# Patient Record
Sex: Female | Born: 1975 | Race: White | Hispanic: No | Marital: Married | Smoking: Never smoker
Health system: Southern US, Community
[De-identification: ages and names within clinical notes are randomized; demographics above are authoritative.]

## PROBLEM LIST (undated history)

## (undated) DIAGNOSIS — H539 Unspecified visual disturbance: Secondary | ICD-10-CM

## (undated) DIAGNOSIS — T7840XA Allergy, unspecified, initial encounter: Secondary | ICD-10-CM

## (undated) DIAGNOSIS — Z9109 Other allergy status, other than to drugs and biological substances: Secondary | ICD-10-CM

## (undated) DIAGNOSIS — I82602 Acute embolism and thrombosis of unspecified veins of left upper extremity: Secondary | ICD-10-CM

## (undated) DIAGNOSIS — U071 COVID-19: Secondary | ICD-10-CM

## (undated) DIAGNOSIS — D6851 Activated protein C resistance: Secondary | ICD-10-CM

## (undated) HISTORY — DX: Activated protein C resistance: D68.51

## (undated) HISTORY — DX: Unspecified visual disturbance: H53.9

## (undated) HISTORY — DX: COVID-19: U07.1

## (undated) HISTORY — DX: Allergy, unspecified, initial encounter: T78.40XA

## (undated) HISTORY — DX: Other allergy status, other than to drugs and biological substances: Z91.09

## (undated) HISTORY — DX: Acute embolism and thrombosis of unspecified veins of left upper extremity: I82.602

---

## 2002-06-11 ENCOUNTER — Other Ambulatory Visit: Admission: RE | Admit: 2002-06-11 | Discharge: 2002-06-11 | Payer: Self-pay | Admitting: Obstetrics & Gynecology

## 2003-06-05 ENCOUNTER — Other Ambulatory Visit: Admission: RE | Admit: 2003-06-05 | Discharge: 2003-06-05 | Payer: Self-pay | Admitting: Obstetrics & Gynecology

## 2003-07-21 ENCOUNTER — Other Ambulatory Visit: Admission: RE | Admit: 2003-07-21 | Discharge: 2003-07-21 | Payer: Self-pay | Admitting: Obstetrics & Gynecology

## 2005-06-14 ENCOUNTER — Inpatient Hospital Stay (HOSPITAL_COMMUNITY): Admission: AD | Admit: 2005-06-14 | Discharge: 2005-06-17 | Payer: Self-pay | Admitting: Obstetrics & Gynecology

## 2011-02-19 ENCOUNTER — Emergency Department (HOSPITAL_BASED_OUTPATIENT_CLINIC_OR_DEPARTMENT_OTHER)
Admission: EM | Admit: 2011-02-19 | Discharge: 2011-02-19 | Disposition: A | Discharge status: Home / Self Care | Payer: BC Managed Care – PPO | Attending: Emergency Medicine | Admitting: Emergency Medicine

## 2011-02-19 DIAGNOSIS — M7989 Other specified soft tissue disorders: Secondary | ICD-10-CM | POA: Insufficient documentation

## 2011-02-19 DIAGNOSIS — R609 Edema, unspecified: Secondary | ICD-10-CM | POA: Insufficient documentation

## 2011-02-20 ENCOUNTER — Other Ambulatory Visit (HOSPITAL_BASED_OUTPATIENT_CLINIC_OR_DEPARTMENT_OTHER): Payer: Self-pay | Admitting: Emergency Medicine

## 2011-02-20 ENCOUNTER — Ambulatory Visit (HOSPITAL_BASED_OUTPATIENT_CLINIC_OR_DEPARTMENT_OTHER)
Admission: RE | Admit: 2011-02-20 | Discharge: 2011-02-20 | Disposition: A | Discharge status: Home / Self Care | Payer: BC Managed Care – PPO | Source: Ambulatory Visit | Attending: Emergency Medicine | Admitting: Emergency Medicine

## 2011-02-20 DIAGNOSIS — M79609 Pain in unspecified limb: Secondary | ICD-10-CM | POA: Insufficient documentation

## 2011-02-20 DIAGNOSIS — R229 Localized swelling, mass and lump, unspecified: Secondary | ICD-10-CM

## 2011-02-20 DIAGNOSIS — R609 Edema, unspecified: Secondary | ICD-10-CM

## 2011-02-20 DIAGNOSIS — M7989 Other specified soft tissue disorders: Secondary | ICD-10-CM | POA: Insufficient documentation

## 2013-08-06 ENCOUNTER — Ambulatory Visit (INDEPENDENT_AMBULATORY_CARE_PROVIDER_SITE_OTHER): Payer: 59 | Admitting: Family Medicine

## 2013-08-06 ENCOUNTER — Encounter: Payer: Self-pay | Admitting: Family Medicine

## 2013-08-06 VITALS — BP 123/94 | HR 74 | Temp 99.2°F | Ht 64.0 in | Wt 216.0 lb

## 2013-08-06 DIAGNOSIS — J069 Acute upper respiratory infection, unspecified: Secondary | ICD-10-CM

## 2013-08-06 NOTE — Progress Notes (Signed)
  Subjective:    Patient ID: April Roman, female    DOB: 1976-08-26, 37 y.o.   MRN: [id number]  HPI  URI Symptoms Onset: 1-2 days  Description: rhinorrhea, nasal congestion Modifying factors:   None   Symptoms Nasal discharge: yes Fever: no Sore throat: no Cough: minimal  Wheezing: no Ear pain: no GI symptoms: no Sick contacts: no  Red Flags  Stiff neck: no Dyspnea: no Rash: no Swallowing difficulty: no  Sinusitis Risk Factors Headache/face pain: no Double sickening: no tooth pain: no  Allergy Risk Factors Sneezing: no Itchy scratchy throat: no Seasonal symptoms: no  Flu Risk Factors Headache: no muscle aches: no severe fatigue: no     Review of Systems  All other systems reviewed and are negative.       Objective:   Physical Exam  Constitutional: She appears well-developed and well-nourished.  HENT:  Head: Normocephalic and atraumatic.  Right Ear: External ear normal.  Left Ear: External ear normal.  +nasal erythema, rhinorrhea bilaterally, + post oropharyngeal erythema    Eyes: Conjunctivae are normal. Pupils are equal, round, and reactive to light.  Neck: Normal range of motion. Neck supple.  Cardiovascular: Normal rate and regular rhythm.   Pulmonary/Chest: Effort normal.  Abdominal: Soft.  Musculoskeletal: Normal range of motion.  Neurological: She is alert.  Skin: Skin is warm.          Assessment & Plan:  Viral URI   Suspect likely viral etiology of symptoms. Discussed supportive care and infectious red flags. Followup as needed.

## 2014-11-26 ENCOUNTER — Other Ambulatory Visit (HOSPITAL_COMMUNITY): Payer: Self-pay | Admitting: Nurse Practitioner

## 2014-11-26 DIAGNOSIS — B182 Chronic viral hepatitis C: Secondary | ICD-10-CM

## 2014-12-10 ENCOUNTER — Ambulatory Visit (HOSPITAL_COMMUNITY): Payer: BC Managed Care – PPO

## 2015-11-09 ENCOUNTER — Ambulatory Visit: Payer: Self-pay | Admitting: Family Medicine

## 2015-11-09 ENCOUNTER — Ambulatory Visit (INDEPENDENT_AMBULATORY_CARE_PROVIDER_SITE_OTHER): Payer: BLUE CROSS/BLUE SHIELD | Admitting: Family

## 2015-11-09 ENCOUNTER — Encounter: Payer: Self-pay | Admitting: Family

## 2015-11-09 VITALS — BP 132/95 | HR 83 | Temp 97.8°F | Ht 64.0 in | Wt 231.0 lb

## 2015-11-09 DIAGNOSIS — J069 Acute upper respiratory infection, unspecified: Secondary | ICD-10-CM

## 2015-11-09 MED ORDER — FLUTICASONE PROPIONATE 50 MCG/ACT NA SUSP: 2.0000 | Freq: Every day | NASAL | Status: DC

## 2015-11-09 MED ORDER — LORATADINE 10 MG PO CAPS: 10.0000 mg | ORAL_CAPSULE | Freq: Every day | ORAL | Status: DC

## 2015-11-09 MED ORDER — AZITHROMYCIN 250 MG PO TABS: ORAL_TABLET | ORAL | Status: DC

## 2015-11-09 NOTE — Progress Notes (Signed)
Subjective:    Patient ID: April Roman, female    DOB: 1976-10-19, 39 y.o.   MRN: [id number]  PT presents to the office to reestablish care and cough. PT states she did an online office visit and was given a RX of Biaxin for one week. PT states this improved her coughing, but continues to cough, sinus pressure, congestion, and rhinorrhea. PT states she is using Flonase and Claritin daily with mild relief.  Cough This is a new problem. The current episode started 1 to 4 weeks ago. The problem has been waxing and waning. The problem occurs every few minutes. The cough is non-productive. Associated symptoms include nasal congestion, postnasal drip and rhinorrhea. Pertinent negatives include no chills, ear congestion, ear pain, fever, headaches, myalgias, sore throat, shortness of breath or wheezing. The symptoms are aggravated by lying down. She has tried rest (Biaxin for one week) for the symptoms. The treatment provided mild relief. There is no history of asthma, COPD or environmental allergies.      Review of Systems  Constitutional: Negative.  Negative for fever and chills.  HENT: Positive for postnasal drip and rhinorrhea. Negative for ear pain and sore throat.   Eyes: Negative.   Respiratory: Positive for cough. Negative for shortness of breath and wheezing.   Cardiovascular: Negative.  Negative for palpitations.  Gastrointestinal: Negative.   Endocrine: Negative.   Genitourinary: Negative.   Musculoskeletal: Negative.  Negative for myalgias.  Allergic/Immunologic: Negative for environmental allergies.  Neurological: Negative.  Negative for headaches.  Hematological: Negative.   Psychiatric/Behavioral: Negative.   All other systems reviewed and are negative.      Objective:   Physical Exam  Constitutional: She is oriented to person, place, and time. She appears well-developed and well-nourished. No distress.  HENT:  Head: Normocephalic and atraumatic.  Right Ear: External ear  normal.  Left Ear: External ear normal.  Nasal passage erythemas with moderate swelling  Oropharynx erythemas     Eyes: Pupils are equal, round, and reactive to light.  Neck: Normal range of motion. Neck supple. No thyromegaly present.  Cardiovascular: Normal rate, regular rhythm, normal heart sounds and intact distal pulses.   No murmur heard. Pulmonary/Chest: Effort normal and breath sounds normal. No respiratory distress. She has no wheezes.  Abdominal: Soft. Bowel sounds are normal. She exhibits no distension. There is no tenderness.  Musculoskeletal: Normal range of motion. She exhibits no edema or tenderness.  Neurological: She is alert and oriented to person, place, and time. She has normal reflexes. No cranial nerve deficit.  Skin: Skin is warm and dry.  Psychiatric: She has a normal mood and affect. Her behavior is normal. Judgment and thought content normal.  Vitals reviewed.     BP 132/95 mmHg  Pulse 83  Temp(Src) 97.8 F (36.6 C) (Oral)  Ht 5' 4"  (1.626 m)  Wt 231 lb (104.781 kg)  BMI 39.63 kg/m2     Assessment & Plan:  1. Acute upper respiratory infection -- Take meds as prescribed - Use a cool mist humidifier  -Use saline nose sprays frequently -Saline irrigations of the nose can be very helpful if done frequently.  * 4X daily for 1 week*  * Use of a nettie pot can be helpful with this. Follow directions with this* -Force fluids -For any cough or congestion  Use plain Mucinex- regular strength or max strength is fine   * Children- consult with Pharmacist for dosing -For fever or aces or pains- take tylenol or ibuprofen  appropriate for age and weight.  * for fevers greater than 101 orally you may alternate ibuprofen and tylenol every  3 hours. -Throat lozenges if help - azithromycin (ZITHROMAX Z-PAK) 250 MG tablet; As directed  Dispense: 1 each; Refill: 0 - fluticasone (FLONASE) 50 MCG/ACT nasal spray; Place 2 sprays into both nostrils daily.  Dispense: 16  g; Refill: 6 - Loratadine (CLARITIN) 10 MG CAPS; Take 1 capsule (10 mg total) by mouth daily.  Dispense: 30 each; Refill: [place], FNP

## 2015-11-09 NOTE — Patient Instructions (Signed)
Upper Respiratory Infection, Adult Most upper respiratory infections (URIs) are a viral infection of the air passages leading to the lungs. A URI affects the nose, throat, and upper air passages. The most common type of URI is nasopharyngitis and is typically referred to as "the common cold." URIs run their course and usually go away on their own. Most of the time, a URI does not require medical attention, but sometimes a bacterial infection in the upper airways can follow a viral infection. This is called a secondary infection. Sinus and middle ear infections are common types of secondary upper respiratory infections. Bacterial pneumonia can also complicate a URI. A URI can worsen asthma and chronic obstructive pulmonary disease (COPD). Sometimes, these complications can require emergency medical care and may be life threatening.  CAUSES Almost all URIs are caused by viruses. A virus is a type of germ and can spread from one person to another.  RISKS FACTORS You may be at risk for a URI if:   You smoke.   You have chronic heart or lung disease.  You have a weakened defense (immune) system.   You are very young or very old.   You have nasal allergies or asthma.  You work in crowded or poorly ventilated areas.  You work in health care facilities or schools. SIGNS AND SYMPTOMS  Symptoms typically develop 2-3 days after you come in contact with a cold virus. Most viral URIs last 7-10 days. However, viral URIs from the influenza virus (flu virus) can last 14-18 days and are typically more severe. Symptoms may include:   Runny or stuffy (congested) nose.   Sneezing.   Cough.   Sore throat.   Headache.   Fatigue.   Fever.   Loss of appetite.   Pain in your forehead, behind your eyes, and over your cheekbones (sinus pain).  Muscle aches.  DIAGNOSIS  Your health care provider may diagnose a URI by:  Physical exam.  Tests to check that your symptoms are not due to  another condition such as:  Strep throat.  Sinusitis.  Pneumonia.  Asthma. TREATMENT  A URI goes away on its own with time. It cannot be cured with medicines, but medicines may be prescribed or recommended to relieve symptoms. Medicines may help:  Reduce your fever.  Reduce your cough.  Relieve nasal congestion. HOME CARE INSTRUCTIONS   Take medicines only as directed by your health care provider.   Gargle warm saltwater or take cough drops to comfort your throat as directed by your health care provider.  Use a warm mist humidifier or inhale steam from a shower to increase air moisture. This may make it easier to breathe.  Drink enough fluid to keep your urine clear or pale yellow.   Eat soups and other clear broths and maintain good nutrition.   Rest as needed.   Return to work when your temperature has returned to normal or as your health care provider advises. You may need to stay home longer to avoid infecting others. You can also use a face mask and careful hand washing to prevent spread of the virus.  Increase the usage of your inhaler if you have asthma.   Do not use any tobacco products, including cigarettes, chewing tobacco, or electronic cigarettes. If you need help quitting, ask your health care provider. PREVENTION  The best way to protect yourself from getting a cold is to practice good hygiene.   Avoid oral or hand contact with people with cold   symptoms.   Wash your hands often if contact occurs.  There is no clear evidence that vitamin C, vitamin E, echinacea, or exercise reduces the chance of developing a cold. However, it is always recommended to get plenty of rest, exercise, and practice good nutrition.  SEEK MEDICAL CARE IF:   You are getting worse rather than better.   Your symptoms are not controlled by medicine.   You have chills.  You have worsening shortness of breath.  You have brown or red mucus.  You have yellow or brown nasal  discharge.  You have pain in your face, especially when you bend forward.  You have a fever.  You have swollen neck glands.  You have pain while swallowing.  You have white areas in the back of your throat. SEEK IMMEDIATE MEDICAL CARE IF:   You have severe or persistent:  Headache.  Ear pain.  Sinus pain.  Chest pain.  You have chronic lung disease and any of the following:  Wheezing.  Prolonged cough.  Coughing up blood.  A change in your usual mucus.  You have a stiff neck.  You have changes in your:  Vision.  Hearing.  Thinking.  Mood. MAKE SURE YOU:   Understand these instructions.  Will watch your condition.  Will get help right away if you are not doing well or get worse.   This information is not intended to replace advice given to you by your health care provider. Make sure you discuss any questions you have with your health care provider.   Document Released: 05/10/2001 Document Revised: 03/31/2015 Document Reviewed: 02/19/2014 Elsevier Interactive Patient Education 2016 [place] meds as prescribed - Use a cool mist humidifier  -Use saline nose sprays frequently -Saline irrigations of the nose can be very helpful if done frequently.  * 4X daily for 1 week*  * Use of a nettie pot can be helpful with this. Follow directions with this* -Force fluids -For any cough or congestion  Use plain Mucinex- regular strength or max strength is fine   * Children- consult with Pharmacist for dosing -For fever or aces or pains- take tylenol or ibuprofen appropriate for age and weight.  * for fevers greater than 101 orally you may alternate ibuprofen and tylenol every  3 hours. -Throat lozenges if help   Evelina Dun, FNP

## 2015-11-20 ENCOUNTER — Ambulatory Visit (INDEPENDENT_AMBULATORY_CARE_PROVIDER_SITE_OTHER): Payer: BLUE CROSS/BLUE SHIELD | Admitting: Family

## 2015-11-20 ENCOUNTER — Encounter: Payer: Self-pay | Admitting: Family

## 2015-11-20 VITALS — BP 128/89 | HR 119 | Temp 98.0°F | Ht 64.0 in | Wt 231.0 lb

## 2015-11-20 DIAGNOSIS — J069 Acute upper respiratory infection, unspecified: Secondary | ICD-10-CM

## 2015-11-20 MED ORDER — BENZONATATE 200 MG PO CAPS: 200.0000 mg | ORAL_CAPSULE | Freq: Three times a day (TID) | ORAL | Status: DC | PRN

## 2015-11-20 MED ORDER — HYDROCODONE-HOMATROPINE 5-1.5 MG/5ML PO SYRP: 5.0000 mL | ORAL_SOLUTION | Freq: Three times a day (TID) | ORAL | Status: DC | PRN

## 2015-11-20 MED ORDER — PREDNISONE 10 MG (21) PO TBPK: 10.0000 mg | ORAL_TABLET | Freq: Every day | ORAL | Status: DC

## 2015-11-20 MED ORDER — LEVOFLOXACIN 500 MG PO TABS: 500.0000 mg | ORAL_TABLET | Freq: Every day | ORAL | Status: DC

## 2015-11-20 NOTE — Patient Instructions (Signed)
Upper Respiratory Infection, Adult Most upper respiratory infections (URIs) are a viral infection of the air passages leading to the lungs. A URI affects the nose, throat, and upper air passages. The most common type of URI is nasopharyngitis and is typically referred to as "the common cold." URIs run their course and usually go away on their own. Most of the time, a URI does not require medical attention, but sometimes a bacterial infection in the upper airways can follow a viral infection. This is called a secondary infection. Sinus and middle ear infections are common types of secondary upper respiratory infections. Bacterial pneumonia can also complicate a URI. A URI can worsen asthma and chronic obstructive pulmonary disease (COPD). Sometimes, these complications can require emergency medical care and may be life threatening.  CAUSES Almost all URIs are caused by viruses. A virus is a type of germ and can spread from one person to another.  RISKS FACTORS You may be at risk for a URI if:   You smoke.   You have chronic heart or lung disease.  You have a weakened defense (immune) system.   You are very young or very old.   You have nasal allergies or asthma.  You work in crowded or poorly ventilated areas.  You work in health care facilities or schools. SIGNS AND SYMPTOMS  Symptoms typically develop 2-3 days after you come in contact with a cold virus. Most viral URIs last 7-10 days. However, viral URIs from the influenza virus (flu virus) can last 14-18 days and are typically more severe. Symptoms may include:   Runny or stuffy (congested) nose.   Sneezing.   Cough.   Sore throat.   Headache.   Fatigue.   Fever.   Loss of appetite.   Pain in your forehead, behind your eyes, and over your cheekbones (sinus pain).  Muscle aches.  DIAGNOSIS  Your health care provider may diagnose a URI by:  Physical exam.  Tests to check that your symptoms are not due to  another condition such as:  Strep throat.  Sinusitis.  Pneumonia.  Asthma. TREATMENT  A URI goes away on its own with time. It cannot be cured with medicines, but medicines may be prescribed or recommended to relieve symptoms. Medicines may help:  Reduce your fever.  Reduce your cough.  Relieve nasal congestion. HOME CARE INSTRUCTIONS   Take medicines only as directed by your health care provider.   Gargle warm saltwater or take cough drops to comfort your throat as directed by your health care provider.  Use a warm mist humidifier or inhale steam from a shower to increase air moisture. This may make it easier to breathe.  Drink enough fluid to keep your urine clear or pale yellow.   Eat soups and other clear broths and maintain good nutrition.   Rest as needed.   Return to work when your temperature has returned to normal or as your health care provider advises. You may need to stay home longer to avoid infecting others. You can also use a face mask and careful hand washing to prevent spread of the virus.  Increase the usage of your inhaler if you have asthma.   Do not use any tobacco products, including cigarettes, chewing tobacco, or electronic cigarettes. If you need help quitting, ask your health care provider. PREVENTION  The best way to protect yourself from getting a cold is to practice good hygiene.   Avoid oral or hand contact with people with cold   symptoms.   Wash your hands often if contact occurs.  There is no clear evidence that vitamin C, vitamin E, echinacea, or exercise reduces the chance of developing a cold. However, it is always recommended to get plenty of rest, exercise, and practice good nutrition.  SEEK MEDICAL CARE IF:   You are getting worse rather than better.   Your symptoms are not controlled by medicine.   You have chills.  You have worsening shortness of breath.  You have brown or red mucus.  You have yellow or brown nasal  discharge.  You have pain in your face, especially when you bend forward.  You have a fever.  You have swollen neck glands.  You have pain while swallowing.  You have white areas in the back of your throat. SEEK IMMEDIATE MEDICAL CARE IF:   You have severe or persistent:  Headache.  Ear pain.  Sinus pain.  Chest pain.  You have chronic lung disease and any of the following:  Wheezing.  Prolonged cough.  Coughing up blood.  A change in your usual mucus.  You have a stiff neck.  You have changes in your:  Vision.  Hearing.  Thinking.  Mood. MAKE SURE YOU:   Understand these instructions.  Will watch your condition.  Will get help right away if you are not doing well or get worse.   This information is not intended to replace advice given to you by your health care provider. Make sure you discuss any questions you have with your health care provider.   Document Released: 05/10/2001 Document Revised: 03/31/2015 Document Reviewed: 02/19/2014 Elsevier Interactive Patient Education 2016 [place] meds as prescribed - Use a cool mist humidifier  -Use saline nose sprays frequently -Saline irrigations of the nose can be very helpful if done frequently.  * 4X daily for 1 week*  * Use of a nettie pot can be helpful with this. Follow directions with this* -Force fluids -For any cough or congestion  Use plain Mucinex- regular strength or max strength is fine   * Children- consult with Pharmacist for dosing -For fever or aces or pains- take tylenol or ibuprofen appropriate for age and weight.  * for fevers greater than 101 orally you may alternate ibuprofen and tylenol every  3 hours. -Throat lozenges if help -New toothbrush in 3 days   April Dun, FNP

## 2015-11-20 NOTE — Progress Notes (Signed)
Subjective:    Patient ID: April Roman, female    DOB: Jul 17, 1976, 39 y.o.   MRN: [id number]  Cough This is a recurrent problem. The current episode started 1 to 4 weeks ago. The problem has been waxing and waning. The problem occurs every few hours. The cough is non-productive. Associated symptoms include chills, ear congestion, a fever, headaches, nasal congestion, postnasal drip, rhinorrhea and wheezing. Pertinent negatives include no ear pain, myalgias, sore throat or shortness of breath. The symptoms are aggravated by lying down. She has tried rest (Zpak, Mucinex, flonase) for the symptoms. The treatment provided mild relief. Her past medical history is significant for environmental allergies. There is no history of asthma.      Review of Systems  Constitutional: Positive for fever and chills.  HENT: Positive for postnasal drip and rhinorrhea. Negative for ear pain and sore throat.   Eyes: Negative.   Respiratory: Positive for cough and wheezing. Negative for shortness of breath.   Cardiovascular: Negative.  Negative for palpitations.  Gastrointestinal: Negative.   Endocrine: Negative.   Genitourinary: Negative.   Musculoskeletal: Negative.  Negative for myalgias.  Allergic/Immunologic: Positive for environmental allergies.  Neurological: Positive for headaches.  Hematological: Negative.   Psychiatric/Behavioral: Negative.   All other systems reviewed and are negative.      Objective:   Physical Exam  Constitutional: She is oriented to person, place, and time. She appears well-developed and well-nourished. No distress.  HENT:  Head: Normocephalic and atraumatic.  Right Ear: External ear normal.  Left Ear: External ear normal.  Nasal passage erythemas with mild swelling  Oropharynx erythemas   Eyes: Pupils are equal, round, and reactive to light.  Neck: Normal range of motion. Neck supple. No thyromegaly present.  Cardiovascular: Normal rate, regular rhythm, normal  heart sounds and intact distal pulses.   No murmur heard. Pulmonary/Chest: Effort normal and breath sounds normal. No respiratory distress. She has no wheezes.  Nonproductive cough  Abdominal: Soft. Bowel sounds are normal. She exhibits no distension. There is no tenderness.  Musculoskeletal: Normal range of motion. She exhibits no edema or tenderness.  Neurological: She is alert and oriented to person, place, and time. She has normal reflexes. No cranial nerve deficit.  Skin: Skin is warm and dry.  Psychiatric: She has a normal mood and affect. Her behavior is normal. Judgment and thought content normal.  Vitals reviewed.     BP 128/89 mmHg  Pulse 119  Temp(Src) 98 F (36.7 C) (Oral)  Ht 5' 4"  (1.626 m)  Wt 231 lb (104.781 kg)  BMI 39.63 kg/m2     Assessment & Plan:  1. Acute upper respiratory infection -- Take meds as prescribed - Use a cool mist humidifier  -Use saline nose sprays frequently -Saline irrigations of the nose can be very helpful if done frequently.  * 4X daily for 1 week*  * Use of a nettie pot can be helpful with this. Follow directions with this* -Force fluids -For any cough or congestion  Use plain Mucinex- regular strength or max strength is fine   * Children- consult with Pharmacist for dosing -For fever or aces or pains- take tylenol or ibuprofen appropriate for age and weight.  * for fevers greater than 101 orally you may alternate ibuprofen and tylenol every  3 hours. -Throat lozenges if help - levofloxacin (LEVAQUIN) 500 MG tablet; Take 1 tablet (500 mg total) by mouth daily.  Dispense: 7 tablet; Refill: 0 - benzonatate (TESSALON) 200 MG capsule;  Take 1 capsule (200 mg total) by mouth 3 (three) times daily as needed.  Dispense: 30 capsule; Refill: 1 - HYDROcodone-homatropine (HYCODAN) 5-1.5 MG/5ML syrup; Take 5 mLs by mouth every 8 (eight) hours as needed for cough.  Dispense: 120 mL; Refill: 0 - predniSONE (STERAPRED UNI-PAK 21 TAB) 10 MG (21) TBPK  tablet; Take 1 tablet (10 mg total) by mouth daily. As directed x 6 days  Dispense: 21 tablet; Refill: 0  Evelina Dun, FNP

## 2015-12-03 ENCOUNTER — Encounter: Payer: Self-pay | Admitting: Family Medicine

## 2015-12-03 ENCOUNTER — Ambulatory Visit (INDEPENDENT_AMBULATORY_CARE_PROVIDER_SITE_OTHER): Payer: BLUE CROSS/BLUE SHIELD | Admitting: Family Medicine

## 2015-12-03 DIAGNOSIS — R52 Pain, unspecified: Secondary | ICD-10-CM | POA: Diagnosis not present

## 2015-12-03 DIAGNOSIS — R509 Fever, unspecified: Secondary | ICD-10-CM | POA: Diagnosis not present

## 2015-12-03 LAB — POCT INFLUENZA A/B
INFLUENZA B, POC: NEGATIVE
Influenza A, POC: NEGATIVE

## 2015-12-03 MED ORDER — OSELTAMIVIR PHOSPHATE 75 MG PO CAPS: 75.0000 mg | ORAL_CAPSULE | Freq: Two times a day (BID) | ORAL | Status: DC

## 2015-12-03 NOTE — Progress Notes (Signed)
   HPI  Patient presents today here with flulike illness.  Patient explains that she's had severe symptoms over the last 24 hours. She had a fever of 100.8 last night. She has a most prominent symptom of body aches and pressure behind her eyes and headache. She also has muscle stiffness in her neck, back, arms and legs. She denies any trouble tolerating clear fluids.  She has no dyspnea, cough, or chest pain. Denies ear pain as well  PMH: Smoking status noted ROS: Per HPI  Objective: BP 148/99 mmHg  Pulse 106  Temp(Src) 97.6 F (36.4 C) (Oral)  Ht 5' 4"  (1.626 m)  Wt 235 lb 12.8 oz (106.958 kg)  BMI 40.45 kg/m2 Gen: NAD, alert, cooperative with exam HEENT: NCAT, right TM normal, left TM obscured by cerumen, nares with some swelling, oropharynx clear CV: RRR, good S1/S2, no murmur Resp: CTABL, no wheezes, non-labored Ext: No edema, warm Neuro: Alert and oriented, No gross deficits  Assessment and plan:  # Body aches, flu-like illness.  Negative rapid flu, but considering symptoms I think that she is falling in teh 30% the rapid flu test misses. Treating with tamiflu NSAIDs, Fluids, rest RTC if worsening or does not get better.     Orders Placed This Encounter  Procedures  . POCT Influenza A/B    No orders of the defined types were placed in this encounter.    Laroy Apple, MD [facility] 12/03/2015, 11:52 AM

## 2015-12-03 NOTE — Patient Instructions (Addendum)
Great to meet you!  I believe you have the flu, so we are treating you with tamiflu   Influenza, Adult Influenza ("the flu") is a viral infection of the respiratory tract. It occurs more often in winter months because people spend more time in close contact with one another. Influenza can make you feel very sick. Influenza easily spreads from person to person (contagious). CAUSES  Influenza is caused by a virus that infects the respiratory tract. You can catch the virus by breathing in droplets from an infected person's cough or sneeze. You can also catch the virus by touching something that was recently contaminated with the virus and then touching your mouth, nose, or eyes. RISKS AND COMPLICATIONS You may be at risk for a more severe case of influenza if you smoke cigarettes, have diabetes, have chronic heart disease (such as heart failure) or lung disease (such as asthma), or if you have a weakened immune system. Elderly people and pregnant women are also at risk for more serious infections. The most common problem of influenza is a lung infection (pneumonia). Sometimes, this problem can require emergency medical care and may be life threatening. SIGNS AND SYMPTOMS  Symptoms typically last 4 to 10 days and may include:  Fever.  Chills.  Headache, body aches, and muscle aches.  Sore throat.  Chest discomfort and cough.  Poor appetite.  Weakness or feeling tired.  Dizziness.  Nausea or vomiting. DIAGNOSIS  Diagnosis of influenza is often made based on your history and a physical exam. A nose or throat swab test can be done to confirm the diagnosis. TREATMENT  In mild cases, influenza goes away on its own. Treatment is directed at relieving symptoms. For more severe cases, your health care provider may prescribe antiviral medicines to shorten the sickness. Antibiotic medicines are not effective because the infection is caused by a virus, not by bacteria. HOME CARE  INSTRUCTIONS  Take medicines only as directed by your health care provider.  Use a cool mist humidifier to make breathing easier.  Get plenty of rest until your temperature returns to normal. This usually takes 3 to 4 days.  Drink enough fluid to keep your urine clear or pale yellow.  Cover yourmouth and nosewhen coughing or sneezing,and wash your handswellto prevent thevirusfrom spreading.  Stay homefromwork orschool untilthe fever is gonefor at least 74fll day. PREVENTION  An annual influenza vaccination (flu shot) is the best way to avoid getting influenza. An annual flu shot is now routinely recommended for all adults in the [place]:  You experiencechest pain, yourcough worsens,or you producemore mucus.  Youhave nausea,vomiting, ordiarrhea.  Your fever returns or gets worse. SEEK IMMEDIATE MEDICAL CARE IF:  You havetrouble breathing, you become short of breath,or your skin ornails becomebluish.  You have severe painor stiffnessin the neck.  You develop a sudden headache, or pain in the face or ear.  You have nausea or vomiting that you cannot control. MAKE SURE YOU:   Understand these instructions.  Will watch your condition.  Will get help right away if you are not doing well or get worse.   This information is not intended to replace advice given to you by your health care provider. Make sure you discuss any questions you have with your health care provider.   Document Released: 11/11/2000 Document Revised: 12/05/2014 Document Reviewed: 02/13/2012 Elsevier Interactive Patient Education 2[employer]

## 2015-12-05 ENCOUNTER — Telehealth: Payer: Self-pay | Admitting: Family Medicine

## 2015-12-05 NOTE — Telephone Encounter (Signed)
I spoke to her husband Darnelle Maffucci this afternoon about concern that she is still ill. Seen on 1/5 by Dr. Wendi Snipes for onset of sx on 1/4 which were dx as Influenza. Caller/husband read about sx online and now concerned about meningitis risk. She is still feeling achy. Intermittent low grade fever. Vomited X 4, Last was yesterday at 10:30 AM. Now holding down small amounts of fluids. No nausea, but appetite poor . She is awake and alert, lucid. Complains of some HA and neck pain, but is able to move neck without excessive pain. Pt. Has no rash.   A: Symptoms do not sound consistent with meningismus. She may have a strain of flu or flu-like illness resistant to tamiflu. P: Pt. Encouraged to continue pushing fluids. Rest. Ibuprofen &/or tylenol as needed.      Consider going to E.D. Or calling 911 for worsening HA, stiffness of neck, resumption of vomiting and especially if there is a decline of mental status.  Claretta Fraise, M.D. 12/05/2015 4:00 PM

## 2015-12-08 ENCOUNTER — Encounter (HOSPITAL_BASED_OUTPATIENT_CLINIC_OR_DEPARTMENT_OTHER): Payer: Self-pay | Admitting: *Deleted

## 2015-12-08 ENCOUNTER — Emergency Department (HOSPITAL_BASED_OUTPATIENT_CLINIC_OR_DEPARTMENT_OTHER)
Admission: EM | Admit: 2015-12-08 | Discharge: 2015-12-08 | Disposition: A | Discharge status: Home / Self Care | Payer: BLUE CROSS/BLUE SHIELD | Attending: Emergency Medicine | Admitting: Emergency Medicine

## 2015-12-08 DIAGNOSIS — Z79899 Other long term (current) drug therapy: Secondary | ICD-10-CM | POA: Insufficient documentation

## 2015-12-08 DIAGNOSIS — R34 Anuria and oliguria: Secondary | ICD-10-CM | POA: Insufficient documentation

## 2015-12-08 DIAGNOSIS — R6883 Chills (without fever): Secondary | ICD-10-CM | POA: Insufficient documentation

## 2015-12-08 DIAGNOSIS — R61 Generalized hyperhidrosis: Secondary | ICD-10-CM | POA: Insufficient documentation

## 2015-12-08 DIAGNOSIS — R338 Other retention of urine: Secondary | ICD-10-CM

## 2015-12-08 DIAGNOSIS — Z3202 Encounter for pregnancy test, result negative: Secondary | ICD-10-CM | POA: Insufficient documentation

## 2015-12-08 DIAGNOSIS — Z7951 Long term (current) use of inhaled steroids: Secondary | ICD-10-CM | POA: Insufficient documentation

## 2015-12-08 DIAGNOSIS — R339 Retention of urine, unspecified: Secondary | ICD-10-CM | POA: Insufficient documentation

## 2015-12-08 DIAGNOSIS — R3915 Urgency of urination: Secondary | ICD-10-CM | POA: Insufficient documentation

## 2015-12-08 LAB — COMPREHENSIVE METABOLIC PANEL
ALT: 23 U/L (ref 14–54)
AST: 19 U/L (ref 15–41)
Albumin: 3.5 g/dL (ref 3.5–5.0)
Alkaline Phosphatase: 108 U/L (ref 38–126)
Anion gap: 5 (ref 5–15)
BILIRUBIN TOTAL: 0.4 mg/dL (ref 0.3–1.2)
BUN: 15 mg/dL (ref 6–20)
CHLORIDE: 107 mmol/L (ref 101–111)
CO2: 27 mmol/L (ref 22–32)
Calcium: 9 mg/dL (ref 8.9–10.3)
Creatinine, Ser: 0.68 mg/dL (ref 0.44–1.00)
Glucose, Bld: 109 mg/dL — ABNORMAL HIGH (ref 65–99)
POTASSIUM: 3.9 mmol/L (ref 3.5–5.1)
Sodium: 139 mmol/L (ref 135–145)
TOTAL PROTEIN: 7.2 g/dL (ref 6.5–8.1)

## 2015-12-08 LAB — CBC WITH DIFFERENTIAL/PLATELET
BASOS ABS: 0 10*3/uL (ref 0.0–0.1)
Basophils Relative: 0 %
EOS ABS: 0.1 10*3/uL (ref 0.0–0.7)
EOS PCT: 1 %
HCT: 38 % (ref 36.0–46.0)
HEMOGLOBIN: 12.2 g/dL (ref 12.0–15.0)
LYMPHS PCT: 17 %
Lymphs Abs: 1.7 10*3/uL (ref 0.7–4.0)
MCH: 28.9 pg (ref 26.0–34.0)
MCHC: 32.1 g/dL (ref 30.0–36.0)
MCV: 90 fL (ref 78.0–100.0)
Monocytes Absolute: 0.6 10*3/uL (ref 0.1–1.0)
Monocytes Relative: 6 %
NEUTROS PCT: 76 %
Neutro Abs: 7.6 10*3/uL (ref 1.7–7.7)
PLATELETS: 375 10*3/uL (ref 150–400)
RBC: 4.22 MIL/uL (ref 3.87–5.11)
RDW: 14 % (ref 11.5–15.5)
WBC: 10.1 10*3/uL (ref 4.0–10.5)

## 2015-12-08 LAB — URINALYSIS, ROUTINE W REFLEX MICROSCOPIC
BILIRUBIN URINE: NEGATIVE
GLUCOSE, UA: NEGATIVE mg/dL
KETONES UR: 15 mg/dL — AB
LEUKOCYTES UA: NEGATIVE
NITRITE: NEGATIVE
PH: 5.5 (ref 5.0–8.0)
PROTEIN: NEGATIVE mg/dL
Specific Gravity, Urine: 1.028 (ref 1.005–1.030)

## 2015-12-08 LAB — URINE MICROSCOPIC-ADD ON

## 2015-12-08 LAB — PREGNANCY, URINE: PREG TEST UR: NEGATIVE

## 2015-12-08 MED ORDER — SODIUM CHLORIDE 0.9 % IV BOLUS (SEPSIS)
1000.0000 mL | Freq: Once | INTRAVENOUS | Status: AC
  Administered 2015-12-08: 1000 mL via INTRAVENOUS

## 2015-12-08 NOTE — ED Provider Notes (Signed)
CSN: [id number]     Arrival date & time 12/08/15  1746 History  By signing my name below, I, Irene Pap, attest that this documentation has been prepared under the direction and in the presence of Quintella Reichert, MD. Electronically Signed: Irene Pap, ED Scribe. 12/08/2015. 7:59 PM.   Chief Complaint  Patient presents with  . Urinary Retention   The history is provided by the patient. No language interpreter was used.  HPI Comments: April Roman is a 40 y.o. female who presents to the Emergency Department complaining of decreased urine output onset 1 day ago. She states that she had urgency, but was unable to void. She recently got over the flu, finishing Tamiflu and Amoxicillin. She reports that she did not drink or eat much yesterday. She reports associated chills and diaphoresis. Per triage note, bladder scan shows 885 ml of urine in pt after 36 hours of not being able to void. She denies a hx of similar symptoms, fever, abdominal pain, vomiting, or leg swelling.  History reviewed. No pertinent past medical history. History reviewed. No pertinent past surgical history. Family History  Problem Relation Age of Onset  . Arthritis Mother   . Hypertension Mother   . Diabetes Father   . Heart disease Father   . Hypertension Father    Social History  Substance Use Topics  . Smoking status: Never Smoker   . Smokeless tobacco: Never Used  . Alcohol Use: Yes     Comment: occ   OB History    No data available     Review of Systems  Constitutional: Positive for chills and diaphoresis. Negative for fever.  Cardiovascular: Negative for leg swelling.  Gastrointestinal: Negative for vomiting and abdominal pain.  Genitourinary: Positive for urgency and decreased urine volume.  All other systems reviewed and are negative.  Allergies  Review of patient's allergies indicates no known allergies.  Home Medications   Prior to Admission medications   Medication Sig Start Date End  Date Taking? Authorizing Provider  acetaminophen (TYLENOL) 325 MG tablet Take 650 mg by mouth every 6 (six) hours as needed.   Yes Historical Provider, MD  amoxicillin-clavulanate (AUGMENTIN) 875-125 MG tablet Take 1 tablet by mouth 2 (two) times daily.   Yes Historical Provider, MD  ibuprofen (ADVIL,MOTRIN) 400 MG tablet Take 400 mg by mouth every 6 (six) hours as needed.   Yes Historical Provider, MD  fluticasone (FLONASE) 50 MCG/ACT nasal spray Place 2 sprays into both nostrils daily. 11/09/15   Sharion Balloon, FNP  oseltamivir (TAMIFLU) 75 MG capsule Take 1 capsule (75 mg total) by mouth 2 (two) times daily. 12/03/15   Timmothy Euler, MD   BP 143/92 mmHg  Pulse 86  Temp(Src) 98.3 F (36.8 C) (Oral)  Resp 20  SpO2 100%  LMP 11/29/2015 Physical Exam  Constitutional: She is oriented to person, place, and time. She appears well-developed and well-nourished.  HENT:  Head: Normocephalic and atraumatic.  Cardiovascular: Normal rate and regular rhythm.   No murmur heard. Pulmonary/Chest: Effort normal and breath sounds normal. No respiratory distress.  Abdominal: Soft. There is no tenderness. There is no rebound and no guarding.  Genitourinary:  Foley catheter in place with dark urine in the bag  Musculoskeletal: She exhibits no edema or tenderness.  Neurological: She is alert and oriented to person, place, and time.  Skin: Skin is warm and dry.  Psychiatric: She has a normal mood and affect. Her behavior is normal.  Nursing note and  vitals reviewed.   ED Course  Procedures (including critical care time) DIAGNOSTIC STUDIES: Oxygen Saturation is 100% on RA, normal by my interpretation.    COORDINATION OF CARE: 6:51 PM-Discussed treatment plan which includes foley catheter and labs with pt at bedside and pt agreed to plan.    Labs Review Labs Reviewed  URINALYSIS, ROUTINE W REFLEX MICROSCOPIC (NOT AT [facility]) - Abnormal; Notable for the following:    Color, Urine AMBER (*)    Hgb  urine dipstick MODERATE (*)    Ketones, ur 15 (*)    All other components within normal limits  COMPREHENSIVE METABOLIC PANEL - Abnormal; Notable for the following:    Glucose, Bld 109 (*)    All other components within normal limits  URINE MICROSCOPIC-ADD ON - Abnormal; Notable for the following:    Squamous Epithelial / LPF 0-5 (*)    Bacteria, UA FEW (*)    All other components within normal limits  URINE CULTURE  CBC WITH DIFFERENTIAL/PLATELET  PREGNANCY, URINE    Imaging Review No results found. I have personally reviewed and evaluated these images and lab results as part of my medical decision-making.   EKG Interpretation None      MDM   Final diagnoses:  Acute urinary retention   Patient here for evaluation of urinary retention. Foley catheter was placed with relief of her symptoms. She was recently treated for URI type symptoms with Tamiflu in this course is completed. She has been taking Sudafed today. Discussed stopping the Sudafed as this may have contributed to her symptoms as well as oral fluid hydration. Recommend outpatient follow-up with urology. Foley catheter discontinued in the emergency department prior to discharge with very close return precautions if the patient develops recurrent retention.  I personally performed the services described in this documentation, which was scribed in my presence. The recorded information has been reviewed and is accurate.    Quintella Reichert, MD 12/09/15 0104

## 2015-12-08 NOTE — ED Notes (Signed)
Bladder scan showed [place] notified and [place] Notified.

## 2015-12-08 NOTE — ED Notes (Signed)
MD at bedside. Patient in no distress.

## 2015-12-08 NOTE — Discharge Instructions (Signed)
Acute Urinary Retention, Female Acute urinary retention is the temporary inability to urinate. This is an uncommon problem in women. It can be caused by:  Infection.  A side effect of a medicine.  A problem in a nearby organ that presses or squeezes on the bladder or the urethra (the tube that drains the bladder).  Psychological problems.   Surgery on your bladder, urethra, or pelvic organs that causes obstruction to the outflow of urine from your bladder. HOME CARE INSTRUCTIONS  If you are sent home with a Foley catheter and a drainage system, you will need to discuss the best course of action with your health care provider. While the catheter is in, maintain a good intake of fluids. Keep the drainage bag emptied and lower than your catheter. This is so that contaminated urine will not flow back into your bladder, which could lead to a urinary tract infection. There are two main types of drainage bags. One is a large bag that usually is used at night. It has a good capacity that will allow you to sleep through the night without having to empty it. The second type is called a leg bag. It has a smaller capacity so it needs to be emptied more frequently. However, the main advantage is that it can be attached by a leg strap and goes underneath your clothing, allowing you the freedom to move about or leave your home. Only take over-the-counter or prescription medicines for pain, discomfort, or fever as directed by your health care provider.  SEEK MEDICAL CARE IF:  You develop a low-grade fever.  You experience spasms or leakage of urine with the spasms. SEEK IMMEDIATE MEDICAL CARE IF:   You develop chills or fever.  Your catheter stops draining urine.  Your catheter falls out.  You start to develop increased bleeding that does not respond to rest and increased fluid intake. MAKE SURE YOU:  Understand these instructions.  Will watch your condition.  Will get help right away if you are  not doing well or get worse.   This information is not intended to replace advice given to you by your health care provider. Make sure you discuss any questions you have with your health care provider.   Document Released: 11/13/2006 Document Revised: 03/31/2015 Document Reviewed: 04/25/2013 Elsevier Interactive Patient Education [employer].

## 2015-12-08 NOTE — ED Notes (Signed)
Pt c/o unable to void x 36 hrs

## 2015-12-10 LAB — URINE CULTURE: CULTURE: NO GROWTH

## 2015-12-21 ENCOUNTER — Ambulatory Visit (INDEPENDENT_AMBULATORY_CARE_PROVIDER_SITE_OTHER): Payer: Self-pay | Admitting: Neurology

## 2015-12-21 ENCOUNTER — Encounter: Payer: Self-pay | Admitting: Neurology

## 2015-12-21 ENCOUNTER — Ambulatory Visit (INDEPENDENT_AMBULATORY_CARE_PROVIDER_SITE_OTHER): Payer: BLUE CROSS/BLUE SHIELD | Admitting: Neurology

## 2015-12-21 VITALS — BP 128/86 | HR 78 | Resp 16 | Ht 64.0 in | Wt 222.4 lb

## 2015-12-21 VITALS — BP 136/96 | HR 104 | Resp 20 | Ht 64.0 in | Wt 222.0 lb

## 2015-12-21 DIAGNOSIS — R338 Other retention of urine: Secondary | ICD-10-CM

## 2015-12-21 DIAGNOSIS — H469 Unspecified optic neuritis: Secondary | ICD-10-CM

## 2015-12-21 DIAGNOSIS — R3911 Hesitancy of micturition: Secondary | ICD-10-CM | POA: Diagnosis not present

## 2015-12-21 DIAGNOSIS — R269 Unspecified abnormalities of gait and mobility: Secondary | ICD-10-CM | POA: Diagnosis not present

## 2015-12-21 DIAGNOSIS — H53132 Sudden visual loss, left eye: Secondary | ICD-10-CM

## 2015-12-21 DIAGNOSIS — G35 Multiple sclerosis: Secondary | ICD-10-CM

## 2015-12-21 DIAGNOSIS — R202 Paresthesia of skin: Secondary | ICD-10-CM

## 2015-12-21 NOTE — Progress Notes (Signed)
[facility]  PATIENT: April Roman DOB: Mar 18, 1976  REFERRING DOCTOR OR PCP:  Referred by Thalia Bloodgood, OD (fax [phone]).  PCP is Redge Gainer (phone [phone]) SOURCE: Patient, husband, mother, notes from Dr. Sherlean Foot, 12/08/2015 ED notes  _________________________________   HISTORICAL  CHIEF COMPLAINT:  Chief Complaint  Patient presents with  . Visual Disturbance    Sts. New Year's weekend she began having pressure feeling behind left eye.  One week later vision left eye became blurry and then progressed--now can't see anything if she is looking straight ahead.  Some peripheral vision to the right and down. She saw an ? optomotrist and sts. was told he thinks she has optic neuritis.  Has not had any treatment./fim    HISTORY OF PRESENT ILLNESS:  I had the pleasure seeing you patient, April Roman, at [facility] for neurologic consultation regarding her left optic neuritis. As you know, she is a 40 year old woman who was in her usual state of good health until 3 weeks ago, around [place].    At that time, she had a respiratory infection and was feeling bad. About 3 or 4 days later, she began to experience pain in the back of the left eye. The pain had a pressure-like quality to it. A few days later (about 7-10 days ago), vision out of the left eye became blurry and progressed over the next few days.    Over the weekend, vision has been more stable. Currently, she feels that vision is normal out of the right eye. However, out of the left eye she has very blurry vision but sees more clearly in the inferior right field. About a week ago, the eye pain improved and no longer is significant.    Around the same time, she had the onset of week of reduced gait and balance. She had difficulty climbing stairs.  Also during that time she had urinary hesitancy.  She went to the emergency room 12/08/15 due to urinary hesitancy.   She had been taking a lot of  antihistamines that may also have contributed to this. Those symptoms have improved over the next week. However, gait is not 100% back to normal. She still feels slightly off balance. She also notices, since that time, mild tingling in her fingertips. That has not progressed.  On January 19 (4 days ago) she tore [place] who diagnosed her with left retrobulbar or neuritis and discuss the hospital association with multiple sclerosis. He noticed that visual acuity was hand waving out of the left eye. Tonometry was normal. Funduscopy showed  normal optic discs.   Due to the concern of multiple sclerosis, she was referred here she was placed on Dr. Cathren Laine schedule and she evaluated the patient and asked me to continue with the physical examination because of the high likelihood of multiple sclerosis.  Currently, she feels that the vision has been about the same for the past week. She can see out of the lower right corner of the left eye. She feels right eye is baseline. She feels that her gait is minimally off balance she is able to climb stairs if she holds onto the rail for balance. She denies any spasticity. She denies any numbness in the arms or legs. She feels bladder function is currently doing well.  She notes some fatigue. She denies depression or anxiety. Not noted any cognitive issues.  There is no family history of MS.  REVIEW OF SYSTEMS: Constitutional: No  fevers, chills, sweats, or change in appetite.    SOme fatigue Eyes: as above Ear, nose and throat: No hearing loss, ear pain, nasal congestion, sore throat Cardiovascular: No chest pain, palpitations Respiratory: No shortness of breath at rest or with exertion.   No wheezes GastrointestinaI: No nausea, vomiting, diarrhea, abdominal pain, fecal incontinence Genitourinary: No dysuria No nocturia.   Recent urinary retention  Musculoskeletal: No neck pain, back pain Integumentary: No rash, pruritus, skin lesions Neurological: as  above Psychiatric: No depression at this time.  No anxiety Endocrine: No palpitations, diaphoresis, change in appetite, change in weigh or increased thirst Hematologic/Lymphatic: No anemia, purpura, petechiae. Allergic/Immunologic: No itchy/runny eyes, nasal congestion, recent allergic reactions, rashes  ALLERGIES: No Known Allergies  HOME MEDICATIONS:  Current outpatient prescriptions:  .  acetaminophen (TYLENOL) 325 MG tablet, Take 650 mg by mouth every 6 (six) hours as needed., Disp: , Rfl:  .  fluticasone (FLONASE) 50 MCG/ACT nasal spray, Place 2 sprays into both nostrils daily., Disp: 16 g, Rfl: 6 .  ibuprofen (ADVIL,MOTRIN) 400 MG tablet, Take 400 mg by mouth every 6 (six) hours as needed., Disp: , Rfl:   PAST MEDICAL HISTORY: Past Medical History  Diagnosis Date  . Environmental allergies   . Vision abnormalities     PAST SURGICAL HISTORY: No past surgical history on file.  FAMILY HISTORY: Family History  Problem Relation Age of Onset  . Arthritis Mother   . Hypertension Mother   . Diabetes Father   . Heart disease Father   . Hypertension Father   . Psoriasis Maternal Aunt     SOCIAL HISTORY:  Social History   Social History  . Marital Status: Married    Spouse Name: N/A  . Number of Children: N/A  . Years of Education: N/A   Occupational History  .  [employer]   Social History Main Topics  . Smoking status: Never Smoker   . Smokeless tobacco: Never Used  . Alcohol Use: Yes     Comment: occ  . Drug Use: No  . Sexual Activity: Yes    Birth Control/ Protection: None   Other Topics Concern  . Not on file   Social History Narrative   Drinks 20-40 oz caffeine daily.     PHYSICAL EXAM  Filed Vitals:   12/21/15 0833  BP: 128/86  Pulse: 78  Resp: 16  Height: _0  (1.626 m)  Weight: 222 lb 6.4 oz (100.88 kg)    Body mass index is 38.16 kg/(m^2).   General: The patient is well-developed and well-nourished and in no acute  distress  Eyes:  Funduscopic exam shows normal optic discs and retinal vessels.  Neck: The neck is supple, no carotid bruits are noted.  The neck is nontender.  Cardiovascular: The heart has a regular rate and rhythm with a normal S1 and S2. There were no murmurs, gallops or rubs. Lungs are clear to auscultation.  Skin: Extremities are without significant edema.  Musculoskeletal:  Back is nontender  Neurologic Exam  Mental status: The patient is alert and oriented x 3 at the time of the examination. The patient has apparent normal recent and remote memory, with an apparently normal attention span and concentration ability.   Speech is normal.  Cranial nerves: Extraocular movements are full. Pupils show a 3+ left 8 for pupillary defect. Out of the left eye, she can only see hand waving except for mild detail in the lower right quadrant. Visual fields were normal on the right  and she has no difficulty reading with the right eye.  Facial symmetry is present. There is good facial sensation to soft touch bilaterally.Facial strength is normal.  Trapezius and sternocleidomastoid strength is normal. No dysarthria is noted.  The tongue is midline, and the patient has symmetric elevation of the soft palate. No obvious hearing deficits are noted.  Motor:  Muscle bulk is normal.   Tone is normal. Strength is  5 / 5 in all 4 extremities.   Sensory: Sensory testing is intact to pinprick, soft touch and vibration sensation in all 4 extremities.  Coordination: Cerebellar testing reveals good finger-nose-finger and heel-to-shin bilaterally.  Gait and station: Station is normal.   Gait is normal. Tandem gait is wide. Romberg is negative.   Reflexes: Deep tendon reflexes are symmetric but increased at knees with spread bilaterally.   Plantar responses are flexor.    DIAGNOSTIC DATA (LABS, IMAGING, TESTING) - I reviewed patient records, labs, notes, testing and imaging myself where available.  Lab  Results  Component Value Date   WBC 10.1 12/08/2015   HGB 12.2 12/08/2015   HCT 38.0 12/08/2015   MCV 90.0 12/08/2015   PLT 375 12/08/2015      Component Value Date/Time   NA 139 12/08/2015 1900   K 3.9 12/08/2015 1900   CL 107 12/08/2015 1900   CO2 27 12/08/2015 1900   GLUCOSE 109* 12/08/2015 1900   BUN 15 12/08/2015 1900   CREATININE 0.68 12/08/2015 1900   CALCIUM 9.0 12/08/2015 1900   PROT 7.2 12/08/2015 1900   ALBUMIN 3.5 12/08/2015 1900   AST 19 12/08/2015 1900   ALT 23 12/08/2015 1900   ALKPHOS 108 12/08/2015 1900   BILITOT 0.4 12/08/2015 1900   GFRNONAA >60 12/08/2015 1900   GFRAA >60 12/08/2015 1900       ASSESSMENT AND PLAN  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, IgG, Hepatic function panel, CBC with Differential/Platelet, Vitamin B12, Sedimentation rate, Pan-ANCA, ANA w/Reflex, MR Brain W Wo Contrast, MR Cervical Spine W Wo Contrast, Stratify JCV Antibody Test ([facility])  Gait disturbance - Plan: Neuromyelitis optica autoab, IgG, Hepatic function panel, CBC with Differential/Platelet, Vitamin B12, Sedimentation rate, Pan-ANCA, ANA w/Reflex, MR Brain W Wo Contrast, MR Cervical Spine W Wo Contrast, Stratify JCV Antibody Test ([facility])  Urinary hesitancy - Plan: Neuromyelitis optica autoab, IgG, Hepatic function panel, CBC with Differential/Platelet, Vitamin B12, Sedimentation rate, Pan-ANCA, ANA w/Reflex, MR Brain W Wo Contrast, MR Cervical Spine W Wo Contrast, Stratify JCV Antibody Test ([facility])   In summary, April Roman is a 40 year old woman with their left optic neuritis who also had a recent episode of gait disturbance and urinary hesitancy. She has severe vision loss on the left on examination with pupillary responses consistent with optic neuritis. Normal funduscopic exam. On Neurologic exam, she also has increased reflexes at the knees and a mildly ataxic tandem gait.     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 In the short-term, I will have her get 3 days of IV Solu-Medrol hope that will help her recover her vision more rapidly.  We discussed further evaluation. She needs to have an MRI of the brain and cervical spinal Roman. If abnormal and consistent with MS, then I will get her started on a disease modifying therapy and we went over some of her  options. I am concerned that she may have had 2 exacerbations in rapid succession (optic neuritis and spinal Roman syndrome) and we would need to consider a more efficacious medicine at the outset.     We also need to assess for MS mimics including Devic's disease and vasculitis.   We will check the neuromyelitis optica auto antibody, ESR, ANA, ANCA and also get a JCV antibody test to help stratify risk to help decide a disease modifying therapy if the diagnosis does turn out to be MS.  We also went over a few aspects of MS including prognosis and other possible MS symptoms.   If she turns out to have Devic's disease or vasculitis, we will also treat that appropriately. If the MRI is normal, then, the chance of MS is lower but still about 25% and we will repeat an MRI later this year.  She will return to the office for the second and third dose of IV Solu-Medrol. If she is not better after 3 days, I will likely do 2 additional days. She is also to call if she has any new's. We will schedule regular follow-up depending on the results of the studies  Thank you for asking [place] to see April Roman for a neurologic consultation. Please let me know if I can be of further assistance with her or other patients in the future.  Richard A. Felecia Shelling, MD, PhD 01/21/8249, 0:37 AM Certified in Neurology, Clinical Neurophysiology, Sleep Medicine, Pain Medicine and Neuroimaging  [facility] [address] [place], [place] [address] [phone]

## 2015-12-21 NOTE — Progress Notes (Signed)
Patient saw a different doctor.

## 2015-12-21 NOTE — Patient Instructions (Signed)
Overall you are doing fairly well but I do want to suggest a few things today:   Remember to drink plenty of fluid, eat healthy meals and do not skip any meals. Try to eat protein with a every meal and eat a healthy snack such as fruit or nuts in between meals. Try to keep a regular sleep-wake schedule and try to exercise daily, particularly in the form of walking, 20-30 minutes a day, if you can.   As far as diagnostic testing: MRI of the brain and neck, labs today  I would like to see you back after imaging, sooner if we need to. Please call us with any interim questions, concerns, problems, updates or refill requests.   Our phone number is [phone]. We also have an after hours call service for urgent matters and there is a physician on-call for urgent questions. For any emergencies you know to call 911 or go to the nearest emergency room

## 2015-12-22 NOTE — Progress Notes (Signed)
NOTES FROM TODAY'S VISIT FAXED TO DR. Thalia Bloodgood FAX # [phone], WITH FAX CONFIRMATION RECEIVED, AND ALSO TO DR. [place] # [phone], WITH FAX CONFIRMATION RECEIVED/fim

## 2015-12-23 ENCOUNTER — Other Ambulatory Visit: Payer: BLUE CROSS/BLUE SHIELD

## 2015-12-23 ENCOUNTER — Ambulatory Visit (INDEPENDENT_AMBULATORY_CARE_PROVIDER_SITE_OTHER): Payer: BLUE CROSS/BLUE SHIELD

## 2015-12-23 DIAGNOSIS — R269 Unspecified abnormalities of gait and mobility: Secondary | ICD-10-CM

## 2015-12-23 DIAGNOSIS — H469 Unspecified optic neuritis: Secondary | ICD-10-CM | POA: Diagnosis not present

## 2015-12-23 DIAGNOSIS — R3911 Hesitancy of micturition: Secondary | ICD-10-CM

## 2015-12-23 LAB — VITAMIN B12: VITAMIN B 12: 397 pg/mL (ref 211–946)

## 2015-12-23 LAB — HEPATIC FUNCTION PANEL
ALK PHOS: 104 IU/L (ref 39–117)
ALT: 17 IU/L (ref 0–32)
AST: 16 IU/L (ref 0–40)
Albumin: 4.2 g/dL (ref 3.5–5.5)
Bilirubin Total: 0.4 mg/dL (ref 0.0–1.2)
Bilirubin, Direct: 0.14 mg/dL (ref 0.00–0.40)
TOTAL PROTEIN: 6.9 g/dL (ref 6.0–8.5)

## 2015-12-23 LAB — PAN-ANCA: Atypical pANCA: <1:20 {titer}

## 2015-12-23 LAB — SEDIMENTATION RATE: Sed Rate: 7 mm/hr (ref 0–32)

## 2015-12-23 LAB — ANA W/REFLEX: ANA: NEGATIVE

## 2015-12-23 LAB — NEUROMYELITIS OPTICA AUTOAB, IGG: NMO IgG Autoantibodies: <1.5 U/mL (ref 0.0–3.0)

## 2015-12-23 MED ORDER — GADOPENTETATE DIMEGLUMINE 469.01 MG/ML IV SOLN: 20.0000 mL | Freq: Once | INTRAVENOUS | Status: DC | PRN

## 2015-12-25 ENCOUNTER — Telehealth: Payer: Self-pay | Admitting: *Deleted

## 2015-12-25 NOTE — Telephone Encounter (Signed)
-----   Message from Britt Bottom, MD sent at 12/24/2015  5:21 PM EST ----- The MRI of the brain and spinal cord did not show any evidence of multiple sclerosis. 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 The chance that she could develop MS with isolated optic neuritis is 25% and we will still need to recheck an MRI in about 9-12 months to make sure that there has not been interval development of lesions.    All of her lab work looks good.

## 2015-12-25 NOTE — Telephone Encounter (Signed)
LMTC./fim

## 2015-12-25 NOTE — Telephone Encounter (Signed)
I have spoken with Judeen Hammans and per RAS, advised that mri brain/c-spine showed no ms lesions; with isolated optic neuritis, the chance of developing ms is about 25%.  Will need to repeat mri in 9-12 mos.  She verbalized understanding of same and is agreeable with this plan, and f/u appt. given for September 19, 2016/fim

## 2015-12-29 ENCOUNTER — Encounter: Payer: Self-pay | Admitting: *Deleted

## 2016-07-25 ENCOUNTER — Encounter: Payer: Self-pay | Admitting: Neurology

## 2016-09-19 ENCOUNTER — Ambulatory Visit: Payer: Self-pay | Admitting: Neurology

## 2018-05-07 DIAGNOSIS — L819 Disorder of pigmentation, unspecified: Secondary | ICD-10-CM | POA: Insufficient documentation

## 2020-01-31 ENCOUNTER — Other Ambulatory Visit: Payer: Self-pay

## 2020-02-03 ENCOUNTER — Other Ambulatory Visit: Payer: Self-pay

## 2020-02-03 ENCOUNTER — Ambulatory Visit (INDEPENDENT_AMBULATORY_CARE_PROVIDER_SITE_OTHER): Payer: 59 | Admitting: Family Medicine

## 2020-02-03 ENCOUNTER — Encounter: Payer: Self-pay | Admitting: Family Medicine

## 2020-02-03 VITALS — BP 125/86 | HR 88 | Temp 97.1°F | Resp 20 | Ht 64.0 in | Wt 237.0 lb

## 2020-02-03 DIAGNOSIS — I82409 Acute embolism and thrombosis of unspecified deep veins of unspecified lower extremity: Secondary | ICD-10-CM

## 2020-02-03 DIAGNOSIS — I82622 Acute embolism and thrombosis of deep veins of left upper extremity: Secondary | ICD-10-CM | POA: Diagnosis not present

## 2020-02-03 HISTORY — DX: Acute embolism and thrombosis of unspecified deep veins of unspecified lower extremity: I82.409

## 2020-02-03 MED ORDER — XARELTO 15 MG PO TABS: 15.0000 mg | ORAL_TABLET | Freq: Two times a day (BID) | ORAL | 1 refills | Status: DC

## 2020-02-03 NOTE — Patient Instructions (Signed)
Health Maintenance, Female Adopting a healthy lifestyle and getting preventive care are important in promoting health and wellness. Ask your health care provider about:  The right schedule for you to have regular tests and exams.  Things you can do on your own to prevent diseases and keep yourself healthy. What should I know about diet, weight, and exercise? Eat a healthy diet   Eat a diet that includes plenty of vegetables, fruits, low-fat dairy products, and lean protein.  Do not eat a lot of foods that are high in solid fats, added sugars, or sodium. Maintain a healthy weight Body mass index (BMI) is used to identify weight problems. It estimates body fat based on height and weight. Your health care provider can help determine your BMI and help you achieve or maintain a healthy weight. Get regular exercise Get regular exercise. This is one of the most important things you can do for your health. Most adults should:  Exercise for at least 150 minutes each week. The exercise should increase your heart rate and make you sweat (moderate-intensity exercise).  Do strengthening exercises at least twice a week. This is in addition to the moderate-intensity exercise.  Spend less time sitting. Even light physical activity can be beneficial. Watch cholesterol and blood lipids Have your blood tested for lipids and cholesterol at 45 years of age, then have this test every 5 years. Have your cholesterol levels checked more often if:  Your lipid or cholesterol levels are high.  You are older than 45 years of age.  You are at high risk for heart disease. What should I know about cancer screening? Depending on your health history and family history, you may need to have cancer screening at various ages. This may include screening for:  Breast cancer.  Cervical cancer.  Colorectal cancer.  Skin cancer.  Lung cancer. What should I know about heart disease, diabetes, and high blood  pressure? Blood pressure and heart disease  High blood pressure causes heart disease and increases the risk of stroke. This is more likely to develop in people who have high blood pressure readings, are of African descent, or are overweight.  Have your blood pressure checked: ? Every 3-5 years if you are 18-39 years of age. ? Every year if you are 40 years old or older. Diabetes Have regular diabetes screenings. This checks your fasting blood sugar level. Have the screening done:  Once every three years after age 40 if you are at a normal weight and have a low risk for diabetes.  More often and at a younger age if you are overweight or have a high risk for diabetes. What should I know about preventing infection? Hepatitis B If you have a higher risk for hepatitis B, you should be screened for this virus. Talk with your health care provider to find out if you are at risk for hepatitis B infection. Hepatitis C Testing is recommended for:  Everyone born from 1945 through 1965.  Anyone with known risk factors for hepatitis C. Sexually transmitted infections (STIs)  Get screened for STIs, including gonorrhea and chlamydia, if: ? You are sexually active and are younger than 45 years of age. ? You are older than 45 years of age and your health care provider tells you that you are at risk for this type of infection. ? Your sexual activity has changed since you were last screened, and you are at increased risk for chlamydia or gonorrhea. Ask your health care provider if   you are at risk.  Ask your health care provider about whether you are at high risk for HIV. Your health care provider may recommend a prescription medicine to help prevent HIV infection. If you choose to take medicine to prevent HIV, you should first get tested for HIV. You should then be tested every 3 months for as long as you are taking the medicine. Pregnancy  If you are about to stop having your period (premenopausal) and  you may become pregnant, seek counseling before you get pregnant.  Take 400 to 800 micrograms (mcg) of folic acid every day if you become pregnant.  Ask for birth control (contraception) if you want to prevent pregnancy. Osteoporosis and menopause Osteoporosis is a disease in which the bones lose minerals and strength with aging. This can result in bone fractures. If you are 65 years old or older, or if you are at risk for osteoporosis and fractures, ask your health care provider if you should:  Be screened for bone loss.  Take a calcium or vitamin D supplement to lower your risk of fractures.  Be given hormone replacement therapy (HRT) to treat symptoms of menopause. Follow these instructions at home: Lifestyle  Do not use any products that contain nicotine or tobacco, such as cigarettes, e-cigarettes, and chewing tobacco. If you need help quitting, ask your health care provider.  Do not use street drugs.  Do not share needles.  Ask your health care provider for help if you need support or information about quitting drugs. Alcohol use  Do not drink alcohol if: ? Your health care provider tells you not to drink. ? You are pregnant, may be pregnant, or are planning to become pregnant.  If you drink alcohol: ? Limit how much you use to 0-1 drink a day. ? Limit intake if you are breastfeeding.  Be aware of how much alcohol is in your drink. In the [place] one drink equals one 12 oz bottle of beer (355 mL), one 5 oz glass of wine (148 mL), or one 1 oz glass of hard liquor (44 mL). General instructions  Schedule regular health, dental, and eye exams.  Stay current with your vaccines.  Tell your health care provider if: ? You often feel depressed. ? You have ever been abused or do not feel safe at home. Summary  Adopting a healthy lifestyle and getting preventive care are important in promoting health and wellness.  Follow your health care provider's instructions about healthy  diet, exercising, and getting tested or screened for diseases.  Follow your health care provider's instructions on monitoring your cholesterol and blood pressure. This information is not intended to replace advice given to you by your health care provider. Make sure you discuss any questions you have with your health care provider. Document Revised: 11/07/2018 Document Reviewed: 11/07/2018 Elsevier Patient Education  2020 [employer]  

## 2020-02-03 NOTE — Progress Notes (Signed)
Subjective:  Patient ID: April Roman, female    DOB: [birth year], 44 y.o.   MRN: [id number]  Patient Care Team: Baruch Gouty, FNP as PCP - General (Family Medicine)   Chief Complaint:  Establish Care (NEW )   HPI: April Roman is a 44 y.o. female presenting on 02/03/2020 for Establish Care (NEW )   Pt presents today to establish care. Pt recently had COVID-19 infection and developed a cephalic vein DVT post [id number] infection. Pt was seen at [facility] and then sent to hematologist where she was diagnosed with Factor V Leiden. She was placed on Xarelto by the hematologist.  States she was advised to stay on the Xarelto for at least 3 months. She has a follow up with hematology scheduled but does not have refills on the Xarelto. Pt states she has been doing fine on the Xarelto. No abnormal bleeding or bruising. States her arm feels much better and she is now able to bend without pain. No chest pain, shortness of breath, palpitations, dizziness, or syncope. No cough or wheezing.      Relevant past medical, surgical, family, and social history reviewed and updated as indicated.  Allergies and medications reviewed and updated. Date reviewed: Chart in Epic.   Past Medical History:  Diagnosis Date  . Allergy   . Arm vein blood clot, left   . COVID-19   . Environmental allergies   . Factor 5 Leiden mutation, heterozygous ([place])   . Vision abnormalities     History reviewed. No pertinent surgical history.  Social History   Socioeconomic History  . Marital status: Married    Spouse name: david   . Number of children: 2  . Years of education: Not on file  . Highest education level: Not on file  Occupational History    Employer: [employer]  Tobacco Use  . Smoking status: Never Smoker  . Smokeless tobacco: Never Used  Substance and Sexual Activity  . Alcohol use: Yes    Comment: occ  . Drug use: No  . Sexual activity: Yes    Birth control/protection: None  Other  Topics Concern  . Not on file  Social History Narrative   Drinks 20-40 oz caffeine daily.   Social Determinants of Health   Financial Resource Strain:   . Difficulty of Paying Living Expenses: Not on file  Food Insecurity:   . Worried About Charity fundraiser in the Last Year: Not on file  . Ran Out of Food in the Last Year: Not on file  Transportation Needs:   . Lack of Transportation (Medical): Not on file  . Lack of Transportation (Non-Medical): Not on file  Physical Activity:   . Days of Exercise per Week: Not on file  . Minutes of Exercise per Session: Not on file  Stress:   . Feeling of Stress : Not on file  Social Connections:   . Frequency of Communication with Friends and Family: Not on file  . Frequency of Social Gatherings with Friends and Family: Not on file  . Attends Religious Services: Not on file  . Active Member of Clubs or Organizations: Not on file  . Attends Archivist Meetings: Not on file  . Marital Status: Not on file  Intimate Partner Violence:   . Fear of Current or Ex-Partner: Not on file  . Emotionally Abused: Not on file  . Physically Abused: Not on file  . Sexually Abused: Not on  file    Outpatient Encounter Medications as of 02/03/2020  Medication Sig  . acetaminophen (TYLENOL) 325 MG tablet Take 650 mg by mouth every 6 (six) hours as needed.  Marland Kitchen Fexofenadine HCl (ALLERGY 24-HR PO) Take by mouth.  Alveda Reasons 15 MG TABS tablet Take 1 tablet (15 mg total) by mouth 2 (two) times daily.  . [DISCONTINUED] albuterol (VENTOLIN HFA) 108 (90 Base) MCG/ACT inhaler Inhale 2 puffs into the lungs every 4 (four) hours as needed.  . [DISCONTINUED] XARELTO 15 MG TABS tablet Take 15 mg by mouth 2 (two) times daily.  . [DISCONTINUED] fluticasone (FLONASE) 50 MCG/ACT nasal spray Place 2 sprays into both nostrils daily.  . [DISCONTINUED] ibuprofen (ADVIL,MOTRIN) 400 MG tablet Take 400 mg by mouth every 6 (six) hours as needed.  . [DISCONTINUED]  gadopentetate dimeglumine (MAGNEVIST) injection 20 mL    No facility-administered encounter medications on file as of 02/03/2020.    No Known Allergies  Review of Systems  Constitutional: Negative for activity change, appetite change, chills, diaphoresis, fatigue, fever and unexpected weight change.  HENT: Negative.   Eyes: Negative.  Negative for photophobia and visual disturbance.  Respiratory: Negative for apnea, cough, choking, chest tightness, shortness of breath, wheezing and stridor.   Cardiovascular: Negative for chest pain, palpitations and leg swelling.  Gastrointestinal: Negative for abdominal pain, blood in stool, constipation, diarrhea, nausea and vomiting.  Endocrine: Negative.   Genitourinary: Negative for decreased urine volume, difficulty urinating, dysuria, frequency, hematuria, urgency and vaginal bleeding.  Musculoskeletal: Negative for arthralgias and myalgias.  Skin: Negative.  Negative for color change, pallor, rash and wound.  Allergic/Immunologic: Negative.   Neurological: Negative for dizziness, tremors, seizures, syncope, facial asymmetry, speech difficulty, weakness, light-headedness, numbness and headaches.  Hematological: Negative.  Does not bruise/bleed easily.  Psychiatric/Behavioral: Negative for confusion, hallucinations, sleep disturbance and suicidal ideas.  All other systems reviewed and are negative.       Objective:  BP 125/86 (BP Location: Right Arm, Cuff Size: Large)   Pulse 88   Temp (!) 97.1 F (36.2 C)   Resp 20   Ht 5' 4"  (1.626 m)   Wt 237 lb (107.5 kg)   LMP 01/13/2020   SpO2 98%   BMI 40.68 kg/m    Wt Readings from Last 3 Encounters:  02/03/20 237 lb (107.5 kg)  12/22/15 222 lb (100.7 kg)  12/22/15 222 lb (100.7 kg)    Physical Exam Vitals and nursing note reviewed.  Constitutional:      General: She is not in acute distress.    Appearance: Normal appearance. She is well-developed and well-groomed. She is morbidly obese.  She is not ill-appearing, toxic-appearing or diaphoretic.  HENT:     Head: Normocephalic and atraumatic.     Jaw: There is normal jaw occlusion.     Right Ear: Hearing normal.     Left Ear: Hearing normal.     Nose: Nose normal.     Mouth/Throat:     Lips: Pink.     Mouth: Mucous membranes are moist.     Pharynx: Oropharynx is clear. Uvula midline.  Eyes:     General: Lids are normal.     Extraocular Movements: Extraocular movements intact.     Conjunctiva/sclera: Conjunctivae normal.     Pupils: Pupils are equal, round, and reactive to light.  Neck:     Thyroid: No thyroid mass, thyromegaly or thyroid tenderness.     Vascular: No carotid bruit or JVD.     Trachea: Trachea  and phonation normal.  Cardiovascular:     Rate and Rhythm: Normal rate and regular rhythm.     Chest Wall: PMI is not displaced.     Pulses: Normal pulses.     Heart sounds: Normal heart sounds. No murmur. No friction rub. No gallop.   Pulmonary:     Effort: Pulmonary effort is normal. No respiratory distress.     Breath sounds: Normal breath sounds. No wheezing.  Abdominal:     General: Bowel sounds are normal. There is no distension or abdominal bruit.     Palpations: Abdomen is soft. There is no hepatomegaly or splenomegaly.     Tenderness: There is no abdominal tenderness. There is no right CVA tenderness or left CVA tenderness.     Hernia: No hernia is present.  Musculoskeletal:        General: No swelling. Normal range of motion.     Cervical back: Normal range of motion and neck supple.     Right lower leg: No edema.     Left lower leg: No edema.  Lymphadenopathy:     Cervical: No cervical adenopathy.  Skin:    General: Skin is warm and dry.     Capillary Refill: Capillary refill takes less than 2 seconds.     Coloration: Skin is not cyanotic, jaundiced or pale.     Findings: No bruising, erythema, lesion or rash.  Neurological:     General: No focal deficit present.     Mental Status: She is  alert and oriented to person, place, and time.     Cranial Nerves: Cranial nerves are intact. No cranial nerve deficit.     Sensory: Sensation is intact. No sensory deficit.     Motor: Motor function is intact. No weakness.     Coordination: Coordination is intact. Coordination normal.     Gait: Gait is intact. Gait normal.     Deep Tendon Reflexes: Reflexes are normal and symmetric. Reflexes normal.  Psychiatric:        Attention and Perception: Attention and perception normal.        Mood and Affect: Mood and affect normal.        Speech: Speech normal.        Behavior: Behavior normal. Behavior is cooperative.        Thought Content: Thought content normal.        Cognition and Memory: Cognition and memory normal.        Judgment: Judgment normal.     Results for orders placed or performed in visit on 12/21/15  Neuromyelitis optica autoab, IgG  Result Value Ref Range   NMO IgG Autoantibodies <1.5 0.0 - 3.0 U/mL  Hepatic function panel  Result Value Ref Range   Total Protein 6.9 6.0 - 8.5 g/dL   Albumin 4.2 3.5 - 5.5 g/dL   Bilirubin Total 0.4 0.0 - 1.2 mg/dL   Bilirubin, Direct 0.14 0.00 - 0.40 mg/dL   Alkaline Phosphatase 104 39 - 117 IU/L   AST 16 0 - 40 IU/L   ALT 17 0 - 32 IU/L  Vitamin B12  Result Value Ref Range   Vitamin B-12 397 211 - 946 pg/mL  Sedimentation rate  Result Value Ref Range   Sed Rate 7 0 - 32 mm/hr  Pan-ANCA  Result Value Ref Range   Myeloperoxidase Ab <9.0 0.0 - 9.0 U/mL   ANCA Proteinase 3 <3.5 0.0 - 3.5 U/mL   C-ANCA <1:20 Neg:<1:20 titer   P-ANCA <  1:20 Neg:<1:20 titer   Atypical pANCA <1:20 Neg:<1:20 titer  ANA w/Reflex  Result Value Ref Range   Anti Nuclear Antibody(ANA) Negative Negative       Pertinent labs & imaging results that were available during my care of the patient were reviewed by me and considered in my medical decision making.  Assessment & Plan:  Shawneen was seen today for establish care.  Diagnoses and all orders for  this visit:  Acute deep vein thrombosis (DVT) of other vein of left upper extremity ([place]) Doing well on Xarelto, continue as prescribed and keep follow up with hematology.  -     XARELTO 15 MG TABS tablet; Take 1 tablet (15 mg total) by mouth 2 (two) times daily.  Pt is due for CPE, will schedule appointment for CPE with PAP.    Continue all other maintenance medications.  Follow up plan: Return in about 3 months (around 05/05/2020), or if symptoms worsen or fail to improve, for CPE with PAP.  Continue healthy lifestyle choices, including diet (rich in fruits, vegetables, and lean proteins, and low in salt and simple carbohydrates) and exercise (at least 30 minutes of moderate physical activity daily).  Educational handout given for health maintenance  The above assessment and management plan was discussed with the patient. The patient verbalized understanding of and has agreed to the management plan. Patient is aware to call the clinic if they develop any new symptoms or if symptoms persist or worsen. Patient is aware when to return to the clinic for a follow-up visit. Patient educated on when it is appropriate to go to the emergency department.   Monia Pouch, FNP-C [facility] [phone]

## 2020-05-06 ENCOUNTER — Encounter: Payer: Self-pay | Admitting: Family Medicine

## 2020-05-06 ENCOUNTER — Other Ambulatory Visit: Payer: Self-pay

## 2020-05-06 ENCOUNTER — Ambulatory Visit (INDEPENDENT_AMBULATORY_CARE_PROVIDER_SITE_OTHER): Payer: 59 | Admitting: Family Medicine

## 2020-05-06 VITALS — BP 141/95 | HR 84 | Temp 97.5°F | Ht 64.0 in | Wt 238.4 lb

## 2020-05-06 DIAGNOSIS — D6851 Activated protein C resistance: Secondary | ICD-10-CM | POA: Diagnosis not present

## 2020-05-06 DIAGNOSIS — Z7689 Persons encountering health services in other specified circumstances: Secondary | ICD-10-CM

## 2020-05-06 DIAGNOSIS — I82622 Acute embolism and thrombosis of deep veins of left upper extremity: Secondary | ICD-10-CM

## 2020-05-06 DIAGNOSIS — L732 Hidradenitis suppurativa: Secondary | ICD-10-CM

## 2020-05-06 DIAGNOSIS — R030 Elevated blood-pressure reading, without diagnosis of hypertension: Secondary | ICD-10-CM

## 2020-05-06 DIAGNOSIS — R232 Flushing: Secondary | ICD-10-CM

## 2020-05-06 DIAGNOSIS — L659 Nonscarring hair loss, unspecified: Secondary | ICD-10-CM

## 2020-05-06 LAB — BAYER DCA HB A1C WAIVED: HB A1C (BAYER DCA - WAIVED): 5.6 % (ref <7.0)

## 2020-05-06 MED ORDER — CLINDAMYCIN PHOSPHATE 1 % EX SOLN: Freq: Two times a day (BID) | CUTANEOUS | 5 refills | Status: DC

## 2020-05-06 NOTE — Patient Instructions (Signed)
You had labs performed today.  You will be contacted with the results of the labs once they are available, usually in the next 3 business days for routine lab work.  If you have an active my chart account, they will be released to your MyChart.  If you prefer to have these labs released to you via telephone, please let us know.  If you had a pap smear or biopsy performed, expect to be contacted in about 7-10 days.  It appears that you have something called hidradenitis suppurativa.  This is a common skin condition where you are prone to infection/ abscess formation.    What YOU can do to decrease recurrence:  1. Wear loose, light clothing  2. Avoid excessive heat, friction, and shearing trauma (protect areas that rub together: thighs, underneath breasts, underneath belly) 3. Wash clothes in detergents that are free of perfumes & dyes (usually marketed as "free and clear") 4. Wash DAILY. Use a gentle, nonsoap cleanser and to wash gently with only your fingers. Scrubbing with washcloths, loofahs, or brushes causes trauma and irritation. If you feel like you have an odor, you can use an antibacterial soap like Dial. 5. If you smoke, STOP. 6. Weight loss.  This is probably the most important one of all if you are overweight.  Excess weight causes hormonal imbalance, insulin resistance and increased shearing forces on the skin.  These ALL lead to increased risk of having infection. 7. Avoid shaving 8. Avoid deodorant 9. Avoidance of dairy (milk, cheese, yogurt, cream).  Some studies have shown that eliminating dairy from your diet has improved symptoms in as soon as 2 weeks.  Make sure to take a daily multivitamin if you choose to do this. 10. Apply a warm compress/ washcloth to affected area several times daily.  This will help the abscess open up, drain and heal.  What should you do if none of the above is helping?  Come back and see me.  We may need to put you on antibiotics, drain your infection  in the office or refer you to a dermatologist.

## 2020-05-06 NOTE — Progress Notes (Signed)
Subjective: CC: est care, DVT PCP: Janora Norlander, DO April Roman is a 44 y.o. female presenting to clinic today for:  1.  Recent DVT Patient had a DVT in the left upper extremity, thought to be precipitated by recent Covid infection.  She is currently seeing hematology about this.  Recently diagnosed with factor V Leiden, which apparently her mother also has.  Apparently there is going to be no need for ongoing anticoagulation.  She has been off of her Xarelto for at least the last week and a half.  She has an appointment with hematology tomorrow.  She reports feeling well.  Her menstrual cycle is a little heavier but she thinks this is related to the anticoagulant.  She does admit to her hair thinning and wonders if she might have a thyroid disorder.  No known family history of thyroid disorder.  She does admit to having difficulty losing weight despite having changed her diet.  She is not currently physically active.  2.  Skin lesion Patient reports bilateral axillary skin lesions that occur intermittently but seem to be associated with menstrual cycles.  She does express some fluid from them.  They always leave behind a dark scar.  She is never used anything to treat them.  3.  Elevated blood pressure reading Patient has had elevated blood pressure reading on the last couple of doctors visits since being diagnosed with Covid.  She has never been on blood pressure medications.  She has been trying to change her diet as above.   ROS: Per HPI  No Known Allergies Past Medical History:  Diagnosis Date   Allergy    Arm vein blood clot, left    COVID-19    Environmental allergies    Factor 5 Leiden mutation, heterozygous ([place])    Vision abnormalities     Current Outpatient Medications:    Fexofenadine HCl (ALLERGY 24-HR PO), Take by mouth., Disp: , Rfl:    XARELTO 15 MG TABS tablet, Take 1 tablet (15 mg total) by mouth 2 (two) times daily., Disp: 180 tablet, Rfl:  1 Social History   Socioeconomic History   Marital status: Married    Spouse name: david    Number of children: 2   Years of education: Not on file   Highest education level: Not on file  Occupational History    Employer: [employer]  Tobacco Use   Smoking status: Never Smoker   Smokeless tobacco: Never Used  Substance and Sexual Activity   Alcohol use: Yes    Comment: occ   Drug use: No   Sexual activity: Yes    Birth control/protection: None  Other Topics Concern   Not on file  Social History Narrative   Drinks 20-40 oz caffeine daily.   Social Determinants of Health   Financial Resource Strain:    Difficulty of Paying Living Expenses:   Food Insecurity:    Worried About Charity fundraiser in the Last Year:    Arboriculturist in the Last Year:   Transportation Needs:    Film/video editor (Medical):    Lack of Transportation (Non-Medical):   Physical Activity:    Days of Exercise per Week:    Minutes of Exercise per Session:   Stress:    Feeling of Stress :   Social Connections:    Frequency of Communication with Friends and Family:    Frequency of Social Gatherings with Friends and Family:    Attends  Religious Services:    Active Member of Clubs or Organizations:    Attends Music therapist:    Marital Status:   Intimate Partner Violence:    Fear of Current or Ex-Partner:    Emotionally Abused:    Physically Abused:    Sexually Abused:    Family History  Problem Relation Age of Onset   Arthritis Mother    Hypertension Mother    Diabetes Father    Heart disease Father    Hypertension Father    Cancer Father        esophogeal    Psoriasis Maternal Aunt    Cirrhosis Maternal Aunt    Dementia Maternal Grandmother    Heart disease Maternal Grandfather    Alzheimer's disease Paternal Grandmother    Heart attack Paternal Grandfather        67s    Cancer Maternal Aunt        breast      Objective: Office vital signs reviewed. BP (!) 141/95    Pulse 84    Temp (!) 97.5 F (36.4 C)    Ht 5' 4"  (1.626 m)    Wt 238 lb 6.4 oz (108.1 kg)    LMP 05/04/2020    SpO2 99%    BMI 40.92 kg/m   Physical Examination:  General: Awake, alert, obese, well appearing, No acute distress HEENT: Normal, sclera white, MMM Cardio: regular rate and rhythm, S1S2 heard, no murmurs appreciated Pulm: clear to auscultation bilaterally, no wheezes, rhonchi or rales; normal work of breathing on room air Extremities: warm, well perfused, No edema, cyanosis or clubbing; +2 pulses bilaterally MSK: normal gait and station Skin : 2 areas of postinflammatory hyperpigmentation noted along the right axilla.  There are palpable scar formations noted.  No active lesions appreciated  Assessment/ Plan: 44 y.o. female   1. Acute deep vein thrombosis (DVT) of other vein of left upper extremity ([facility]) Currently off of anticoagulation.  Will await recommendations by hematology tomorrow  2. Factor V Leiden ([place]) - CBC  3. Establishing care with new doctor, encounter for  4. Morbid obesity ([facility]) Check nonfasting labs - TSH - Lipid Panel - CMP14+EGFR - VITAMIN D 25 Hydroxy (Vit-D Deficiency, Fractures) - Bayer DCA Hb A1c Waived  5. Hair thinning - TSH  6. Hot flashes - FSH/LH  7. Elevated blood pressure reading without diagnosis of hypertension If persistently elevated next visit, plan for initiation of antihypertensive  8. Axillary hidradenitis suppurativa Discussed at bedtime with the patient.  Handout provided.  Trial of clindamycin.  Follow-up for annual physical with Pap - clindamycin (CLEOCIN-T) 1 % external solution; Apply topically 2 (two) times daily.  Dispense: 30 mL; Refill: 5   No orders of the defined types were placed in this encounter.  No orders of the defined types were placed in this encounter.    Janora Norlander, DO [place] [phone]

## 2020-05-07 LAB — LIPID PANEL
Chol/HDL Ratio: 3.8 ratio (ref 0.0–4.4)
Cholesterol, Total: 168 mg/dL (ref 100–199)
HDL: 44 mg/dL (ref >39)
LDL Chol Calc (NIH): 102 mg/dL — ABNORMAL HIGH (ref 0–99)
Triglycerides: 124 mg/dL (ref 0–149)
VLDL Cholesterol Cal: 22 mg/dL (ref 5–40)

## 2020-05-07 LAB — CMP14+EGFR
ALT: 30 IU/L (ref 0–32)
AST: 23 IU/L (ref 0–40)
Albumin/Globulin Ratio: 1.2 (ref 1.2–2.2)
Albumin: 3.8 g/dL (ref 3.8–4.8)
Alkaline Phosphatase: 133 IU/L — ABNORMAL HIGH (ref 48–121)
BUN/Creatinine Ratio: 17 (ref 9–23)
BUN: 15 mg/dL (ref 6–24)
Bilirubin Total: <0.2 mg/dL (ref 0.0–1.2)
CO2: 20 mmol/L (ref 20–29)
Calcium: 9.2 mg/dL (ref 8.7–10.2)
Chloride: 105 mmol/L (ref 96–106)
Creatinine, Ser: 0.86 mg/dL (ref 0.57–1.00)
GFR calc Af Amer: 94 mL/min/{1.73_m2} (ref >59)
GFR calc non Af Amer: 82 mL/min/{1.73_m2} (ref >59)
Globulin, Total: 3.3 g/dL (ref 1.5–4.5)
Glucose: 99 mg/dL (ref 65–99)
Potassium: 4.4 mmol/L (ref 3.5–5.2)
Sodium: 138 mmol/L (ref 134–144)
Total Protein: 7.1 g/dL (ref 6.0–8.5)

## 2020-05-07 LAB — CBC
Hematocrit: 36.9 % (ref 34.0–46.6)
Hemoglobin: 12 g/dL (ref 11.1–15.9)
MCH: 28.1 pg (ref 26.6–33.0)
MCHC: 32.5 g/dL (ref 31.5–35.7)
MCV: 86 fL (ref 79–97)
Platelets: 384 10*3/uL (ref 150–450)
RBC: 4.27 x10E6/uL (ref 3.77–5.28)
RDW: 13.2 % (ref 11.7–15.4)
WBC: 7 10*3/uL (ref 3.4–10.8)

## 2020-05-07 LAB — FSH/LH
FSH: 9.7 m[IU]/mL
LH: 9.5 m[IU]/mL

## 2020-05-07 LAB — TSH: TSH: 1.22 u[IU]/mL (ref 0.450–4.500)

## 2020-05-07 LAB — VITAMIN D 25 HYDROXY (VIT D DEFICIENCY, FRACTURES): Vit D, 25-Hydroxy: 24 ng/mL — ABNORMAL LOW (ref 30.0–100.0)

## 2020-06-19 ENCOUNTER — Ambulatory Visit (INDEPENDENT_AMBULATORY_CARE_PROVIDER_SITE_OTHER): Payer: 59 | Admitting: Family Medicine

## 2020-06-19 ENCOUNTER — Other Ambulatory Visit (HOSPITAL_COMMUNITY)
Admission: RE | Admit: 2020-06-19 | Discharge: 2020-06-19 | Disposition: A | Discharge status: Home / Self Care | Payer: 59 | Source: Ambulatory Visit | Attending: Family Medicine | Admitting: Family Medicine

## 2020-06-19 ENCOUNTER — Other Ambulatory Visit: Payer: Self-pay

## 2020-06-19 ENCOUNTER — Encounter: Payer: Self-pay | Admitting: Family Medicine

## 2020-06-19 VITALS — BP 136/90 | HR 89 | Temp 97.9°F | Ht 64.0 in | Wt 242.6 lb

## 2020-06-19 DIAGNOSIS — N63 Unspecified lump in unspecified breast: Secondary | ICD-10-CM

## 2020-06-19 DIAGNOSIS — Z124 Encounter for screening for malignant neoplasm of cervix: Secondary | ICD-10-CM

## 2020-06-19 DIAGNOSIS — Z01411 Encounter for gynecological examination (general) (routine) with abnormal findings: Secondary | ICD-10-CM | POA: Diagnosis not present

## 2020-06-19 DIAGNOSIS — Z01419 Encounter for gynecological examination (general) (routine) without abnormal findings: Secondary | ICD-10-CM

## 2020-06-19 NOTE — Patient Instructions (Signed)

## 2020-06-19 NOTE — Progress Notes (Signed)
April Roman is a 44 y.o. female presents to office today for annual physical exam examination.    Concerns today include: 1. Breast lump Patient reports a right sided breast lump that she noticed in the last several days.  She reports intermittent pain in the right axilla since her COVID shot in April.  Occupation: financial (works with Copywriter, advertising), Substance use: none Diet: fair, Exercise: no structured Last colonoscopy: UTD Last mammogram: Needs Last pap smear: needs  Past Medical History:  Diagnosis Date  . Allergy   . Arm vein blood clot, left   . COVID-19   . Environmental allergies   . Factor 5 Leiden mutation, heterozygous ([place])   . Vision abnormalities    Social History   Socioeconomic History  . Marital status: Married    Spouse name: April Roman   . Number of children: 2  . Years of education: Not on file  . Highest education level: Not on file  Occupational History    Employer: [employer]  Tobacco Use  . Smoking status: Never Smoker  . Smokeless tobacco: Never Used  Vaping Use  . Vaping Use: Never used  Substance and Sexual Activity  . Alcohol use: Yes    Comment: occ  . Drug use: No  . Sexual activity: Yes    Birth control/protection: None  Other Topics Concern  . Not on file  Social History Narrative   Drinks 20-40 oz caffeine daily.   Social Determinants of Health   Financial Resource Strain:   . Difficulty of Paying Living Expenses:   Food Insecurity:   . Worried About Charity fundraiser in the Last Year:   . Arboriculturist in the Last Year:   Transportation Needs:   . Film/video editor (Medical):   Marland Kitchen Lack of Transportation (Non-Medical):   Physical Activity:   . Days of Exercise per Week:   . Minutes of Exercise per Session:   Stress:   . Feeling of Stress :   Social Connections:   . Frequency of Communication with Friends and Family:   . Frequency of Social Gatherings with Friends and Family:   . Attends Religious Services:   .  Active Member of Clubs or Organizations:   . Attends Archivist Meetings:   Marland Kitchen Marital Status:   Intimate Partner Violence:   . Fear of Current or Ex-Partner:   . Emotionally Abused:   Marland Kitchen Physically Abused:   . Sexually Abused:    History reviewed. No pertinent surgical history. Family History  Problem Relation Age of Onset  . Arthritis Mother   . Hypertension Mother   . Diabetes Father   . Heart disease Father   . Hypertension Father   . Cancer Father        esophogeal   . Psoriasis Maternal Aunt   . Cirrhosis Maternal Aunt   . Dementia Maternal Grandmother   . Heart disease Maternal Grandfather   . Alzheimer's disease Paternal Grandmother   . Heart attack Paternal Grandfather        72s   . Cancer Maternal Aunt        breast     Current Outpatient Medications:  .  clindamycin (CLEOCIN-T) 1 % external solution, Apply topically 2 (two) times daily., Disp: 30 mL, Rfl: 5 .  Fexofenadine HCl (ALLERGY 24-HR PO), Take by mouth., Disp: , Rfl:  .  XARELTO 15 MG TABS tablet, Take 1 tablet (15 mg total) by mouth 2 (two)  times daily., Disp: 180 tablet, Rfl: 1  No Known Allergies   ROS: Review of Systems Pertinent items noted in HPI and remainder of comprehensive ROS otherwise negative.    Physical exam BP (!) 136/90   Pulse 89   Temp 97.9 F (36.6 C)   Ht 5' 4"  (1.626 m)   Wt (!) 242 lb 9.6 oz (110 kg)   LMP 06/01/2020   SpO2 97%   BMI 41.64 kg/m  General appearance: alert, cooperative, appears stated age, no distress and moderately obese Head: Normocephalic, without obvious abnormality, atraumatic Eyes: negative findings: lids and lashes normal, conjunctivae and sclerae normal, corneas clear and pupils equal, round, reactive to light and accomodation Ears: normal TM's and external ear canals both ears Nose: Nares normal. Septum midline. Mucosa normal. No drainage or sinus tenderness. Throat: lips, mucosa, and tongue normal; teeth and gums normal Neck: no  adenopathy, no carotid bruit, supple, symmetrical, trachea midline and thyroid not enlarged, symmetric, no tenderness/mass/nodules Back: symmetric, no curvature. ROM normal. No CVA tenderness. Lungs: clear to auscultation bilaterally Breasts: No nipple retraction or dimpling, No nipple discharge or bleeding, 2 slightly enlarged lymph nodes in right axilla.  there is a small mobile mass noted at the 8 oclock position on the right. rubbery in consistency suggestive of fibroadenoma Heart: regular rate and rhythm, S1, S2 normal, no murmur, click, rub or gallop Abdomen: soft, non-tender; bowel sounds normal; no masses,  no organomegaly Pelvic: external genitalia normal, no adnexal masses or tenderness, no cervical motion tenderness, rectovaginal septum normal, uterus normal size, shape, and consistency and white, mucous discharge Extremities: extremities normal, atraumatic, no cyanosis or edema Pulses: 2+ and symmetric Skin: Skin color, texture, turgor normal. No rashes or lesions Lymph nodes: as above Neurologic: Alert and oriented X 3, normal strength and tone. Normal symmetric reflexes. Normal coordination and gait Psych: mood stable Depression screen [facility] 2/9 06/19/2020 05/06/2020 02/03/2020  Decreased Interest 0 0 0  Down, Depressed, Hopeless 0 0 0  PHQ - 2 Score 0 0 0    Assessment/ Plan: April Roman here for annual physical exam.   1. Well woman exam with routine gynecological exam - Cytology - PAP  2. Screening for malignant neoplasm of cervix - Cytology - PAP  3. Breast lump in female Likely fibroadenoma but will evaluate with mammo - MM DIAG BREAST TOMO BILATERAL; Future - US BREAST COMPLETE UNI RIGHT INC AXILLA; Future   Counseled on healthy lifestyle choices, including diet (rich in fruits, vegetables and lean meats and low in salt and simple carbohydrates) and exercise (at least 30 minutes of moderate physical activity daily).  Patient to follow up in 1 year for annual exam or  sooner if needed.  April Roman M. Lajuana Ripple, DO

## 2020-06-22 LAB — CYTOLOGY - PAP
Comment: NEGATIVE
Diagnosis: NEGATIVE
High risk HPV: NEGATIVE

## 2020-07-08 ENCOUNTER — Ambulatory Visit
Admission: RE | Admit: 2020-07-08 | Discharge: 2020-07-08 | Disposition: A | Discharge status: Home / Self Care | Payer: BLUE CROSS/BLUE SHIELD | Source: Ambulatory Visit | Attending: Family Medicine | Admitting: Family Medicine

## 2020-07-08 ENCOUNTER — Other Ambulatory Visit: Payer: Self-pay

## 2020-07-08 DIAGNOSIS — N63 Unspecified lump in unspecified breast: Secondary | ICD-10-CM

## 2021-01-12 IMAGING — MG DIGITAL DIAGNOSTIC BILAT W/ TOMO W/ CAD
6 of 12 series · 6 of 36 positions shown · non-contrast
Comparison: None.

CLINICAL DATA: New mass felt by the patient in the upper outer
right breast.

EXAM:
DIGITAL DIAGNOSTIC BILATERAL MAMMOGRAM WITH CAD AND TOMO
ULTRASOUND RIGHT BREAST

[R MLO synth-2D]
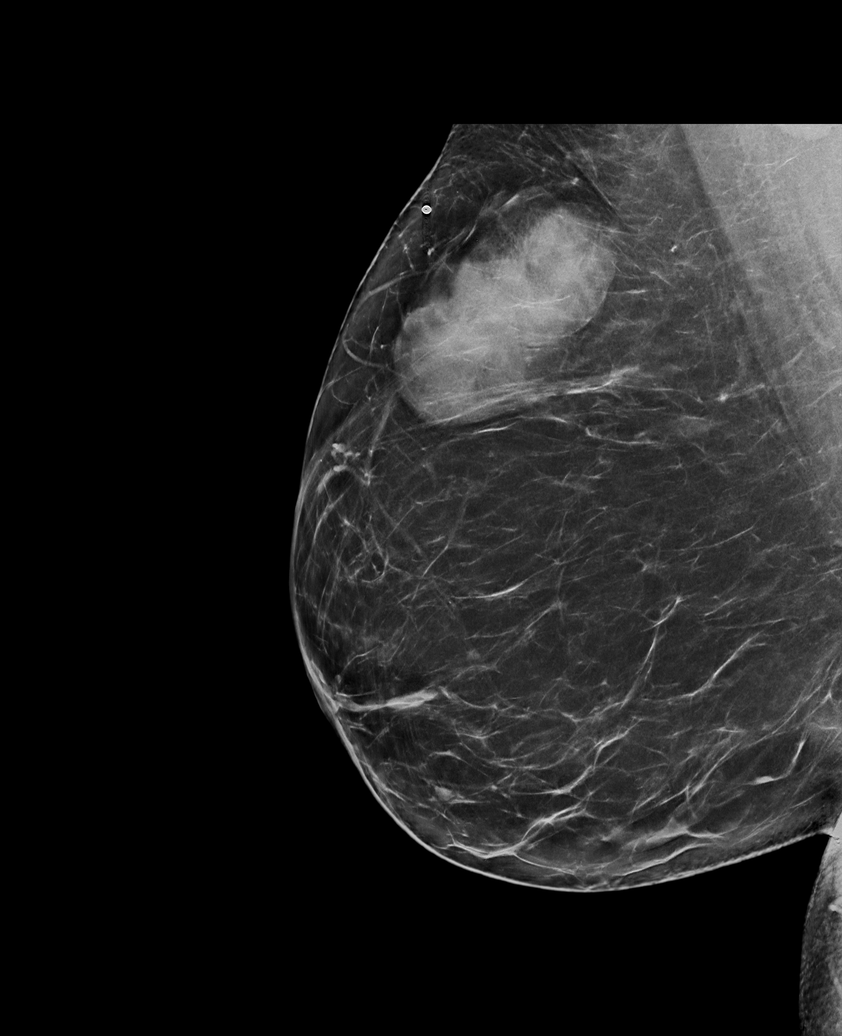

[R CC synth-2D]
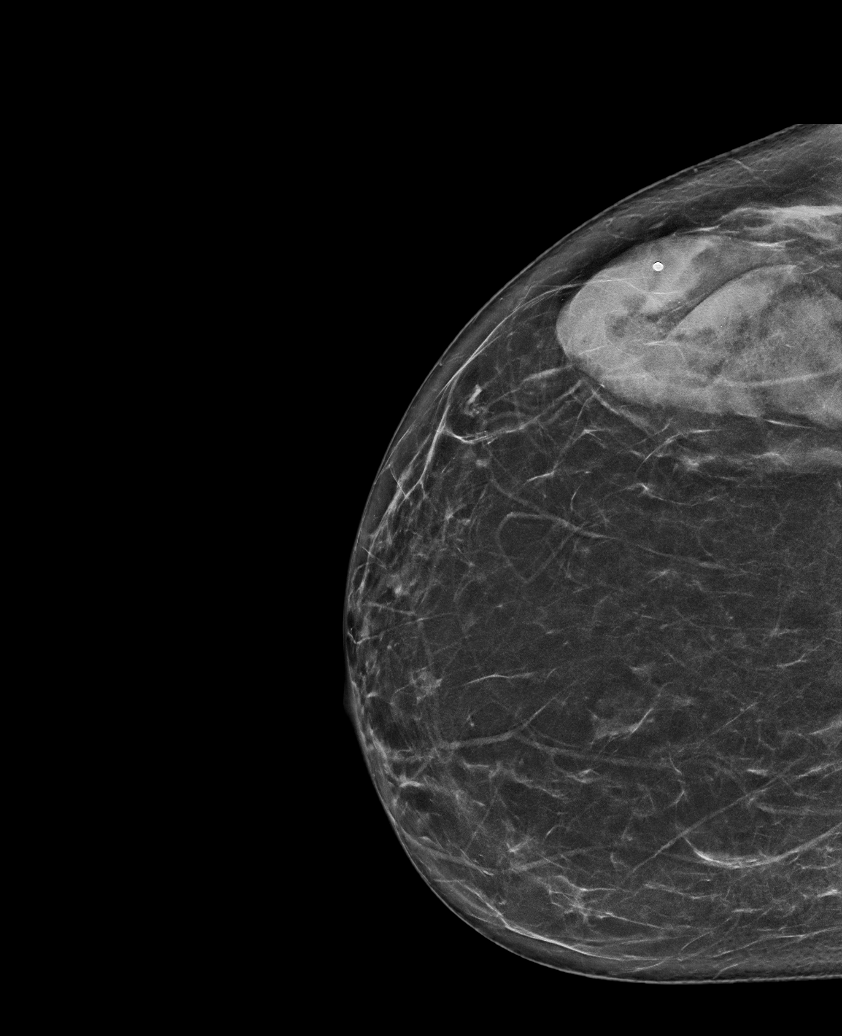

[R XCCL synth-2D]
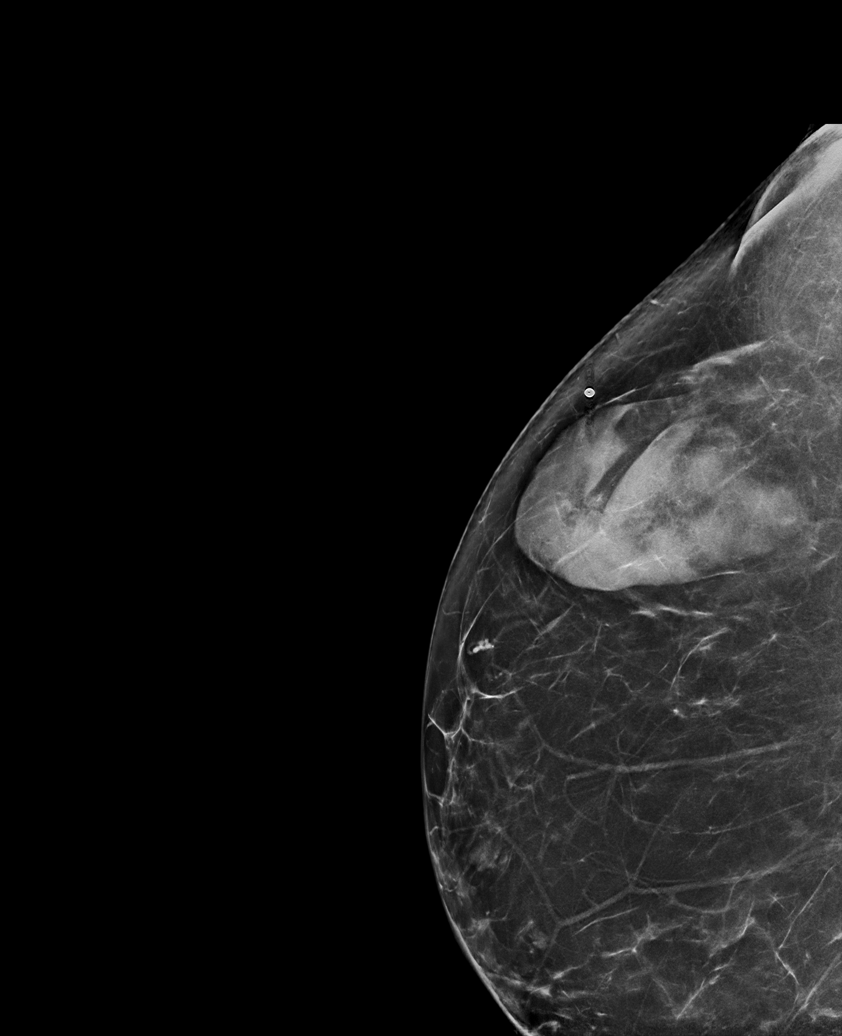

[L MLO synth-2D]
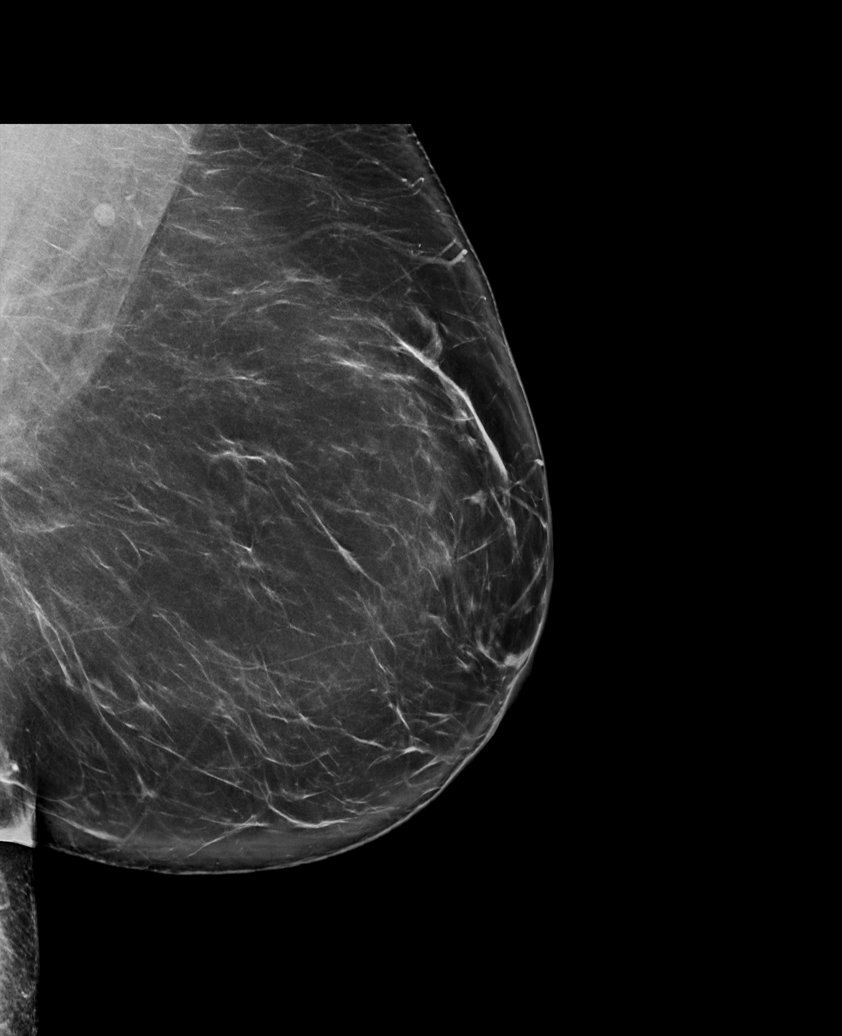

[R TAN synth-2D]
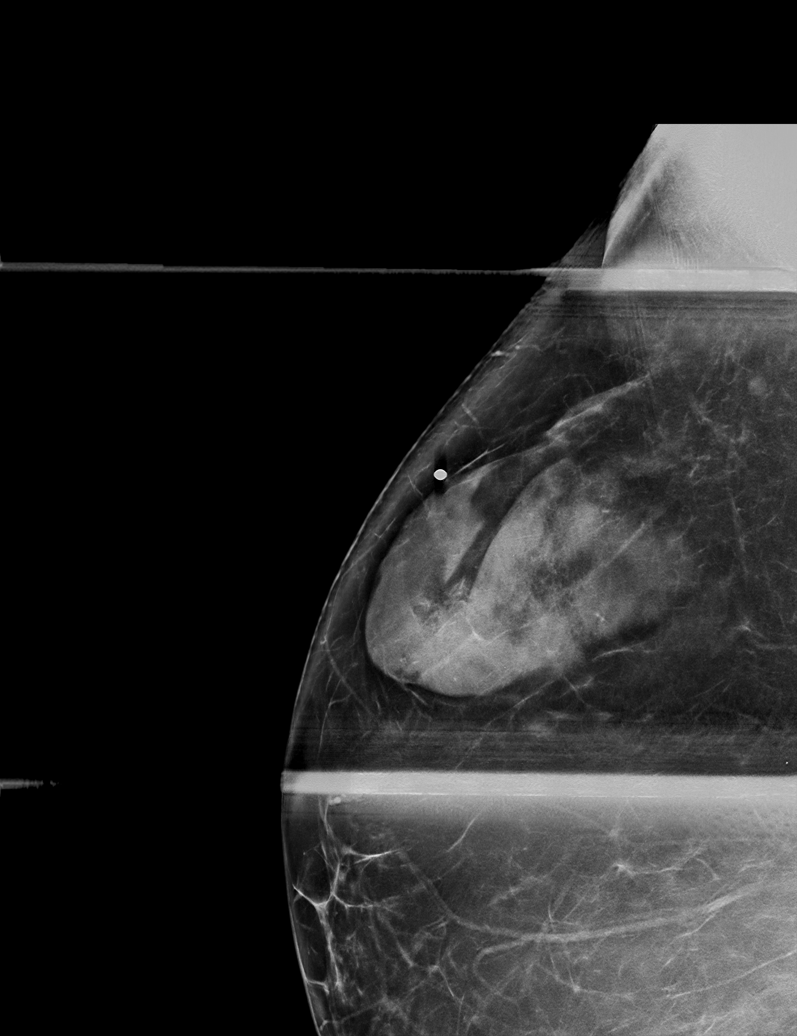

[L CC synth-2D]
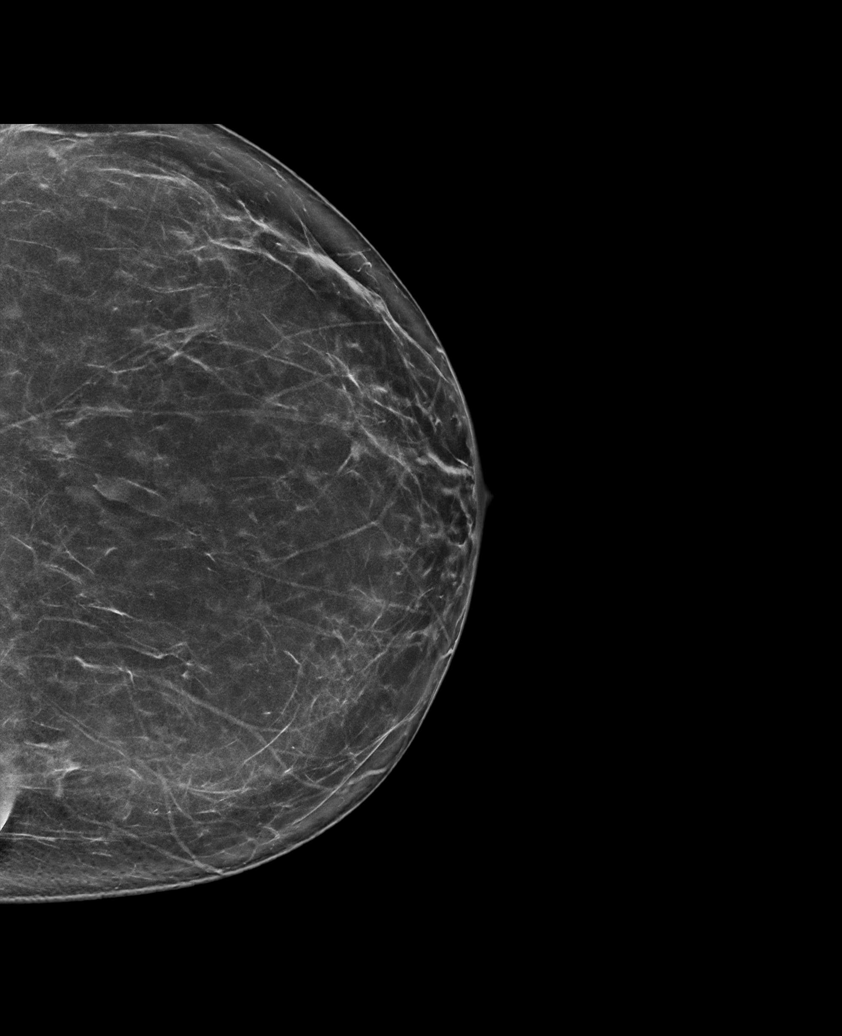

[6 of 36 positions shown; findings below may reference images not displayed]

ACR Breast Density Category b: There are scattered areas of
fibroglandular density.
FINDINGS: There is an oval, circumscribed mass with high density in fat
density components in the upper outer right breast. This corresponds
to the palpable mass, marked with a metallic marker. No findings
elsewhere in either breast suspicious for malignancy.

Mammographic images were processed with CAD.

On physical exam, there is an approximately 1.5 cm oval, faintly
palpable mass in the 10 o'clock position of the right breast, 10 cm
from the nipple.

Targeted ultrasound is performed, showing a 6.0 x 4.6 x 2.2 cm oval,
horizontally oriented, circumscribed mass in the 10 o'clock position
of the right breast, 10 cm from the. This contains intermixed
echogenic and hypoechoic components as well as small oval cystic
components.
IMPRESSION: 1. 6.0 cm benign fibroadenolipoma (hamartoma) in the 10 o'clock
position of the right breast.
2. No evidence of malignancy in either breast.

RECOMMENDATION:
Bilateral screening mammogram in 1 year.

I have discussed the findings and recommendations with the patient.
If applicable, a reminder letter will be sent to the patient
regarding the next appointment.

BI-RADS CATEGORY  2: Benign.

## 2021-01-12 IMAGING — US US BREAST*R* LIMITED INC AXILLA
1 series · 9 of 9 positions shown · non-contrast
Comparison: None.

CLINICAL DATA: New mass felt by the patient in the upper outer
right breast.

EXAM:
DIGITAL DIAGNOSTIC BILATERAL MAMMOGRAM WITH CAD AND TOMO
ULTRASOUND RIGHT BREAST

[Series 1: us breast*right* limited inc axilla · 0.07mm/px · 9 of 9 slices shown]
[im 1/9]
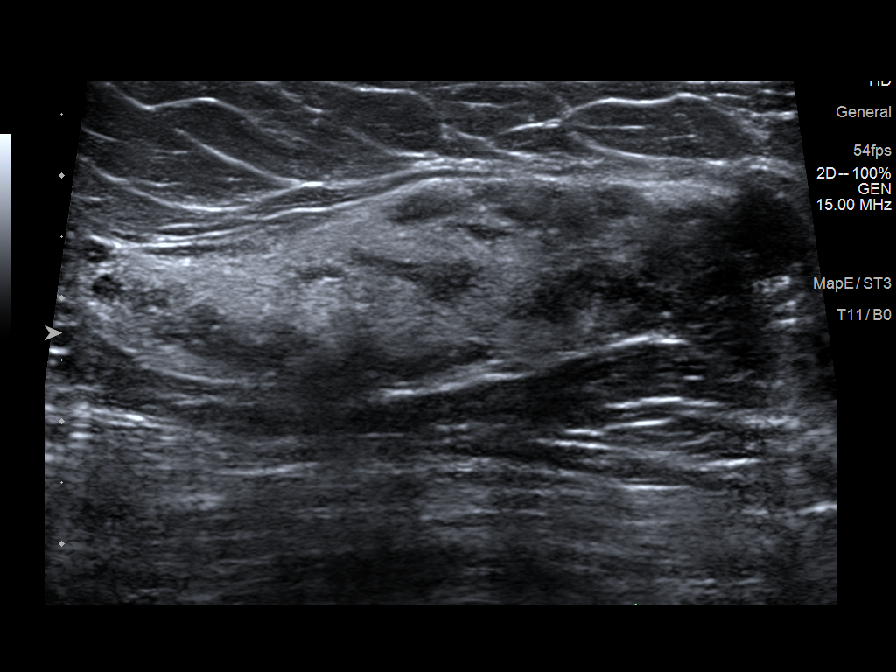
[im 2/9]
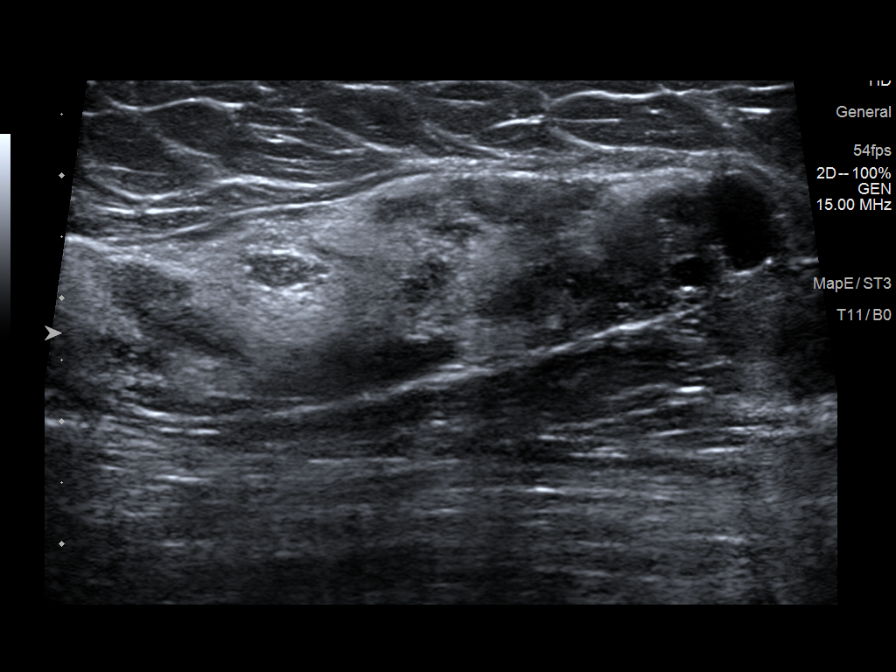
[im 3/9]
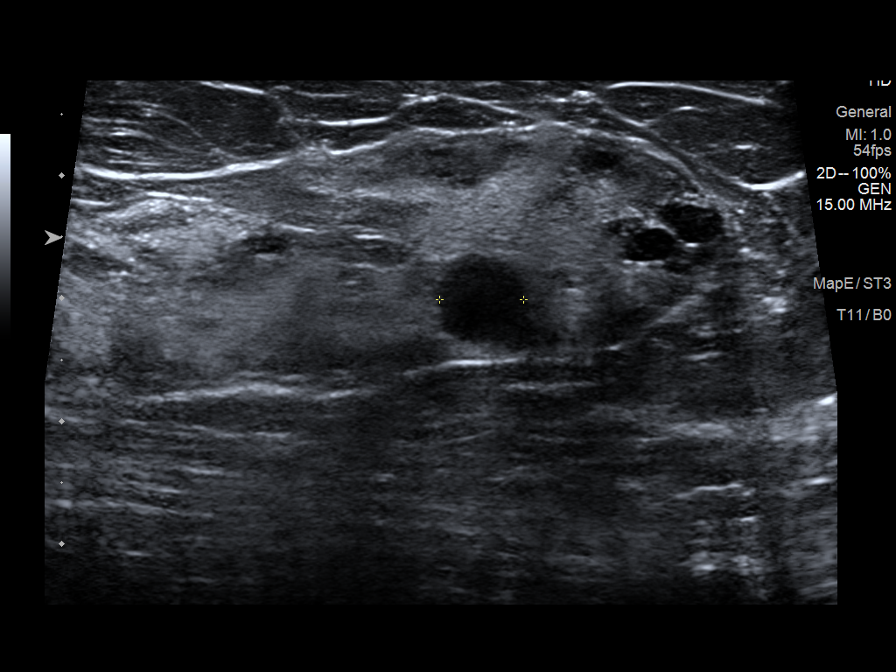
[im 4/9]
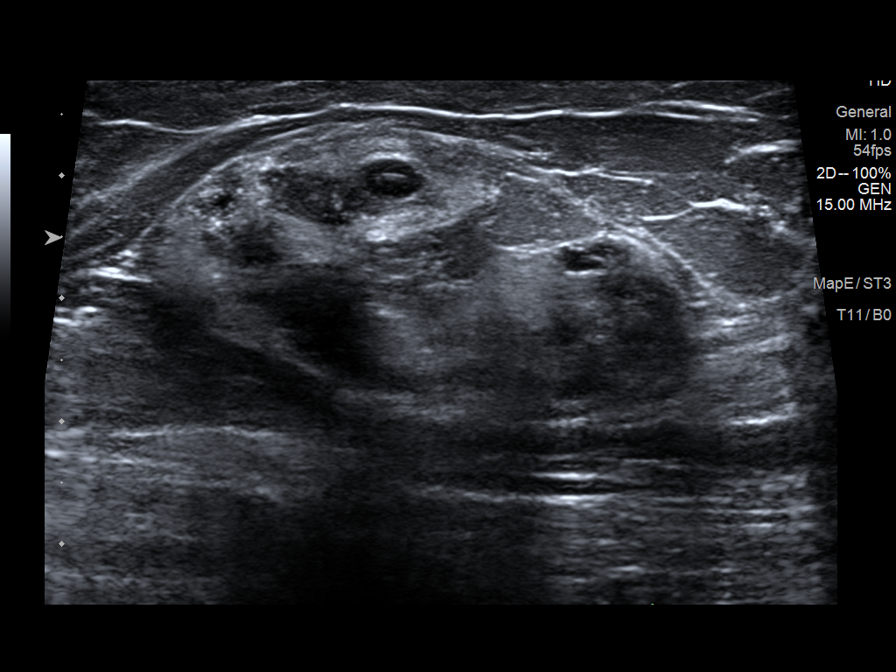
[im 5/9]
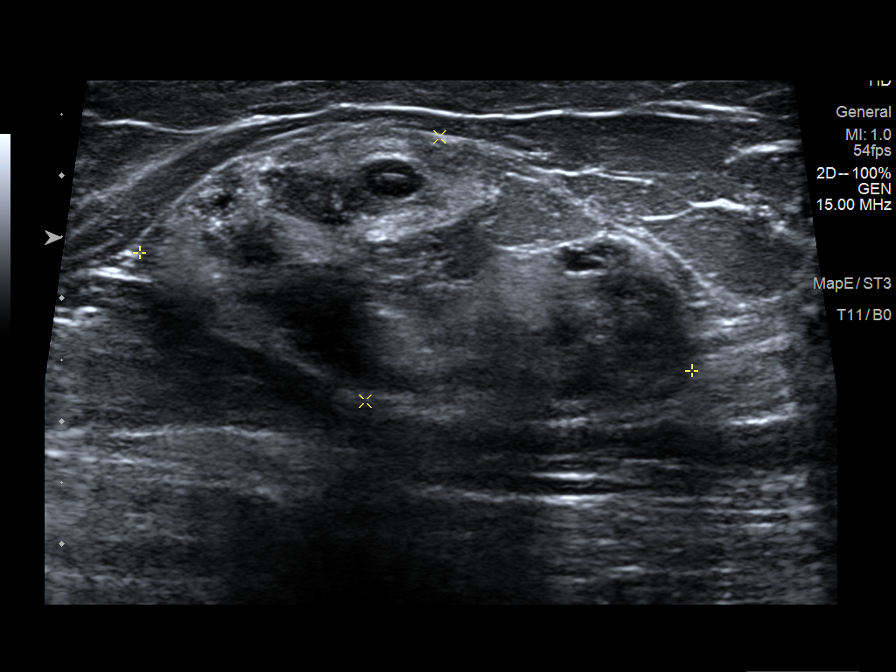
[im 6/9]
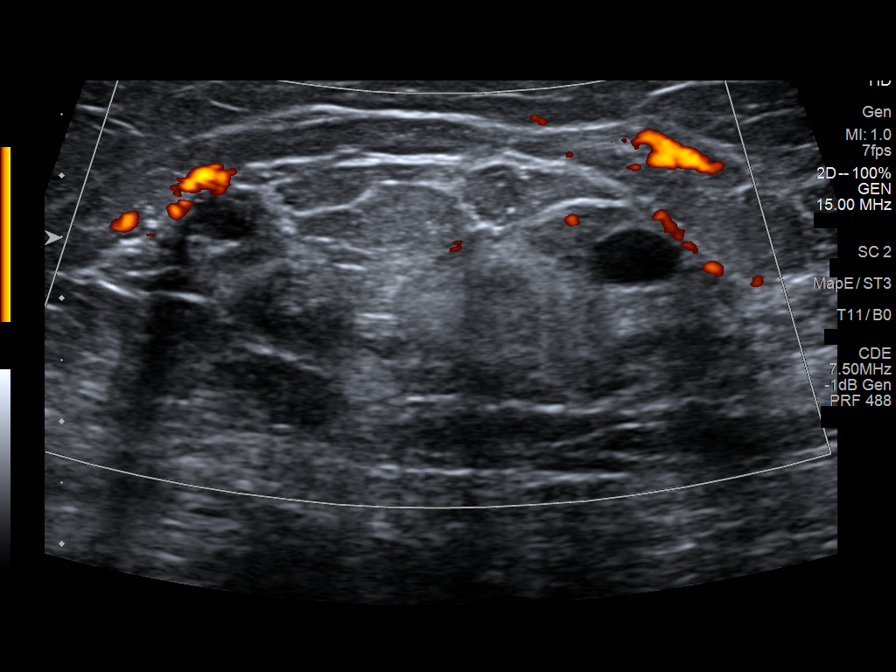
[im 7/9]
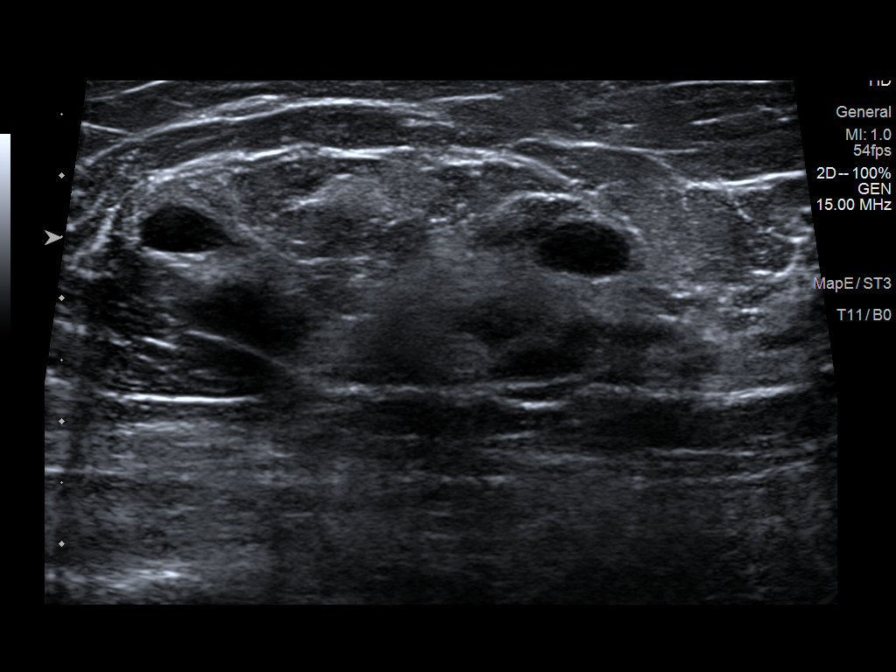
[im 8/9]
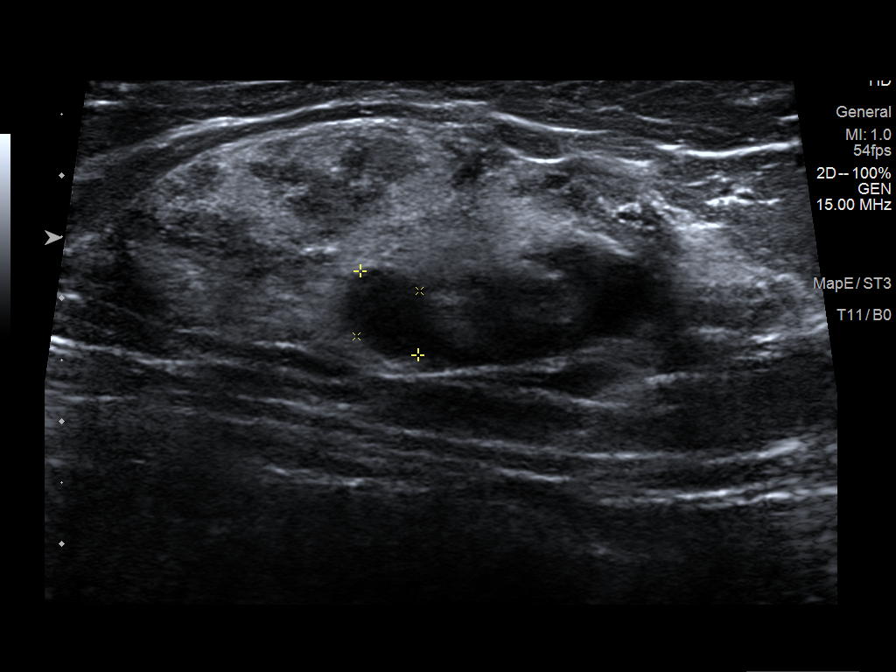
[im 9/9]
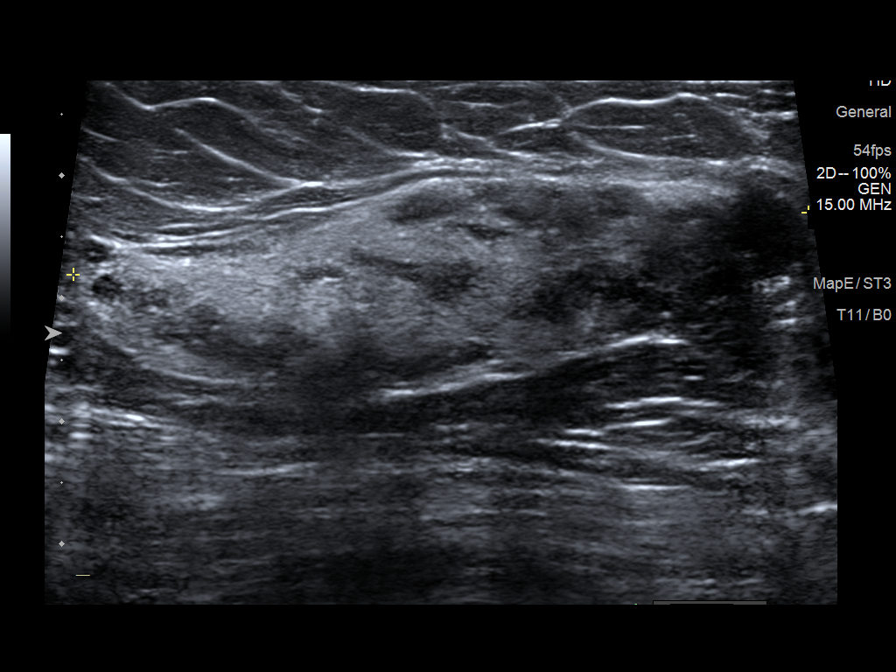

[9 of 9 positions shown; findings below may reference images not displayed]

ACR Breast Density Category b: There are scattered areas of
fibroglandular density.
FINDINGS: There is an oval, circumscribed mass with high density in fat
density components in the upper outer right breast. This corresponds
to the palpable mass, marked with a metallic marker. No findings
elsewhere in either breast suspicious for malignancy.

Mammographic images were processed with CAD.

On physical exam, there is an approximately 1.5 cm oval, faintly
palpable mass in the 10 o'clock position of the right breast, 10 cm
from the nipple.

Targeted ultrasound is performed, showing a 6.0 x 4.6 x 2.2 cm oval,
horizontally oriented, circumscribed mass in the 10 o'clock position
of the right breast, 10 cm from the. This contains intermixed
echogenic and hypoechoic components as well as small oval cystic
components.
IMPRESSION: 1. 6.0 cm benign fibroadenolipoma (hamartoma) in the 10 o'clock
position of the right breast.
2. No evidence of malignancy in either breast.

RECOMMENDATION:
Bilateral screening mammogram in 1 year.

I have discussed the findings and recommendations with the patient.
If applicable, a reminder letter will be sent to the patient
regarding the next appointment.

BI-RADS CATEGORY  2: Benign.

## 2021-02-03 ENCOUNTER — Telehealth (INDEPENDENT_AMBULATORY_CARE_PROVIDER_SITE_OTHER): Payer: 59 | Admitting: Family Medicine

## 2021-02-03 ENCOUNTER — Encounter: Payer: Self-pay | Admitting: Family Medicine

## 2021-02-03 DIAGNOSIS — J029 Acute pharyngitis, unspecified: Secondary | ICD-10-CM | POA: Diagnosis not present

## 2021-02-03 LAB — RAPID STREP SCREEN (MED CTR MEBANE ONLY): Strep Gp A Ag, IA W/Reflex: NEGATIVE

## 2021-02-03 LAB — CULTURE, GROUP A STREP

## 2021-02-03 MED ORDER — AMOXICILLIN 500 MG PO CAPS: 500.0000 mg | ORAL_CAPSULE | Freq: Two times a day (BID) | ORAL | 0 refills | Status: AC

## 2021-02-03 NOTE — Addendum Note (Signed)
Addended by: Liliane Bade on: 02/03/2021 02:32 PM   Modules accepted: Orders

## 2021-02-03 NOTE — Progress Notes (Signed)
Virtual Visit via Video note  I connected with April Roman on 02/03/21 at 1:54 PM by video and verified that I am speaking with the correct person using two identifiers. April Roman is currently located at home and nobody is currently with her during visit. The provider, Loman Brooklyn, FNP is located in their office at time of visit.  I discussed the limitations, risks, security and privacy concerns of performing an evaluation and management service by video and the availability of in person appointments. I also discussed with the patient that there may be a patient responsible charge related to this service. The patient expressed understanding and agreed to proceed.  Subjective: PCP: Janora Norlander, DO  Chief Complaint  Patient presents with  . Sore Throat   Patient reports she started feeling bad three days ago.  Two days ago her throat started hurting and she developed a low-grade fever.  Her throat has progressively gotten worse every day.  She looked today and noticed that she does have white spots on her tonsils.  She denies any upper respiratory symptoms, headache, nausea, and vomiting.   ROS: Per HPI  Current Outpatient Medications:  .  clindamycin (CLEOCIN-T) 1 % external solution, Apply topically 2 (two) times daily., Disp: 30 mL, Rfl: 5 .  Fexofenadine HCl (ALLERGY 24-HR PO), Take by mouth., Disp: , Rfl:  .  XARELTO 15 MG TABS tablet, Take 1 tablet (15 mg total) by mouth 2 (two) times daily., Disp: 180 tablet, Rfl: 1  No Known Allergies Past Medical History:  Diagnosis Date  . Allergy   . Arm vein blood clot, left   . COVID-19   . Environmental allergies   . Factor 5 Leiden mutation, heterozygous ([place])   . Vision abnormalities     Observations/Objective: Physical Exam Constitutional:      General: She is not in acute distress.    Appearance: Normal appearance. She is not ill-appearing or toxic-appearing.  Eyes:     General: No scleral icterus.        Right eye: No discharge.        Left eye: No discharge.     Conjunctiva/sclera: Conjunctivae normal.  Pulmonary:     Effort: Pulmonary effort is normal. No respiratory distress.  Neurological:     Mental Status: She is alert and oriented to person, place, and time.  Psychiatric:        Mood and Affect: Mood normal.        Behavior: Behavior normal.        Thought Content: Thought content normal.        Judgment: Judgment normal.    Assessment and Plan: 1. Sore throat Treating empirically for strep throat given her symptoms.  Tylenol/ibuprofen for aches/pains or fever. - Rapid Strep Screen ([facility] ONLY); Future - Culture, Group A Strep; Future - amoxicillin (AMOXIL) 500 MG capsule; Take 1 capsule (500 mg total) by mouth 2 (two) times daily for 10 days.  Dispense: 20 capsule; Refill: 0   Follow Up Instructions:   I discussed the assessment and treatment plan with the patient. The patient was provided an opportunity to ask questions and all were answered. The patient agreed with the plan and demonstrated an understanding of the instructions.   The patient was advised to call back or seek an in-person evaluation if the symptoms worsen or if the condition fails to improve as anticipated.  The above assessment and management plan was discussed with the  patient. The patient verbalized understanding of and has agreed to the management plan. Patient is aware to call the clinic if symptoms persist or worsen. Patient is aware when to return to the clinic for a follow-up visit. Patient educated on when it is appropriate to go to the emergency department.   Time call ended: 2:05 PM  I provided 11 minutes of face-to-face time during this encounter.   Hendricks Limes, MSN, APRN, FNP-C [facility] 02/03/21

## 2021-02-06 LAB — CULTURE, GROUP A STREP

## 2022-02-14 ENCOUNTER — Encounter: Payer: Self-pay | Admitting: Family Medicine

## 2022-02-14 ENCOUNTER — Ambulatory Visit (INDEPENDENT_AMBULATORY_CARE_PROVIDER_SITE_OTHER): Payer: 59 | Admitting: Family Medicine

## 2022-02-14 DIAGNOSIS — G43009 Migraine without aura, not intractable, without status migrainosus: Secondary | ICD-10-CM | POA: Diagnosis not present

## 2022-02-14 DIAGNOSIS — I10 Essential (primary) hypertension: Secondary | ICD-10-CM | POA: Diagnosis not present

## 2022-02-14 MED ORDER — NURTEC 75 MG PO TBDP: ORAL_TABLET | ORAL | 0 refills | Status: DC

## 2022-02-14 MED ORDER — HYDROCHLOROTHIAZIDE 25 MG PO TABS: 25.0000 mg | ORAL_TABLET | Freq: Every day | ORAL | 3 refills | Status: DC

## 2022-02-14 NOTE — Progress Notes (Signed)
? ?Subjective: ?CC: Migraine headaches, hypertension ?PCP: Janora Norlander, DO ?April Roman is a 46 y.o. female presenting to clinic today for: ? ?1.  Hypertension/migraines ?This is a new diagnosis for the patient.  She has had multiple elevated blood pressures at various facilities.  She unfortunately was lost to follow-up with [place] last year.  She notes that blood pressures have been running 606T to 016W systolic over 10X to 323F diastolic.  She has been getting these checked routinely at her weight loss center.  She recently enrolled in a weight loss program.   ? ?She had increased frequency of her migraine headaches which she describes pain and pressure behind her left eye.  She does report nausea associated and fatigue.  Typically a Tylenol and to Motrin will relieve the symptoms.  This is occurring at least 2 times per month now. ? ? ?ROS: Per HPI ? ?No Known Allergies ?Past Medical History:  ?Diagnosis Date  ? Allergy   ? Arm vein blood clot, left   ? COVID-19   ? Environmental allergies   ? Factor 5 Leiden mutation, heterozygous ([place])   ? Vision abnormalities   ? ? ?Current Outpatient Medications:  ?  clindamycin (CLEOCIN-T) 1 % external solution, Apply topically 2 (two) times daily., Disp: 30 mL, Rfl: 5 ?  Fexofenadine HCl (ALLERGY 24-HR PO), Take by mouth., Disp: , Rfl:  ?Social History  ? ?Socioeconomic History  ? Marital status: Married  ?  Spouse name: Shanon Brow   ? Number of children: 2  ? Years of education: Not on file  ? Highest education level: Not on file  ?Occupational History  ?  Employer: [employer]  ?Tobacco Use  ? Smoking status: Never  ? Smokeless tobacco: Never  ?Vaping Use  ? Vaping Use: Never used  ?Substance and Sexual Activity  ? Alcohol use: Yes  ?  Comment: occ  ? Drug use: No  ? Sexual activity: Yes  ?  Birth control/protection: None  ?Other Topics Concern  ? Not on file  ?Social History Narrative  ? Drinks 20-40 oz caffeine daily.  ? ?Social Determinants of Health   ? ?Financial Resource Strain: Not on file  ?Food Insecurity: Not on file  ?Transportation Needs: Not on file  ?Physical Activity: Not on file  ?Stress: Not on file  ?Social Connections: Not on file  ?Intimate Partner Violence: Not on file  ? ?Family History  ?Problem Relation Age of Onset  ? Arthritis Mother   ? Hypertension Mother   ? Diabetes Father   ? Heart disease Father   ? Hypertension Father   ? Cancer Father   ?     esophogeal   ? Psoriasis Maternal Aunt   ? Cirrhosis Maternal Aunt   ? Dementia Maternal Grandmother   ? Heart disease Maternal Grandfather   ? Alzheimer's disease Paternal Grandmother   ? Heart attack Paternal Grandfather   ?     79s   ? Cancer Maternal Aunt   ?     breast   ? ? ?Objective: ?Office vital signs reviewed. ?BP (!) 160/101   Pulse 84   Temp (!) 97.1 ?F (36.2 ?C)   Resp 20   Ht 5' 4"  (1.626 m)   Wt 234 lb (106.1 kg)   LMP 02/13/2022 (Exact Date)   SpO2 99%   BMI 40.17 kg/m?  ? ?Physical Examination:  ?General: Awake, alert, well nourished, No acute distress ?HEENT: PERRLA, EOMI ?Cardio: regular rate and rhythm, S1S2  heard, no murmurs appreciated ?Pulm: clear to auscultation bilaterally, no wheezes, rhonchi or rales; normal work of breathing on room air ?Extremities: warm, well perfused, No edema, cyanosis or clubbing; +2 pulses bilaterally ?MSK: Normal gait and station ?Neuro: No focal neurologic deficits ? ?Assessment/ Plan: ?46 y.o. female  ? ?Morbid obesity ([place]) ? ?Essential hypertension - Plan: hydrochlorothiazide (HYDRODIURIL) 25 MG tablet ? ?Migraine without aura and without status migrainosus, not intractable - Plan: Rimegepant Sulfate (NURTEC) 75 MG TBDP ? ?She is currently enrolled in a weight loss program.  Continue current regimen with lifestyle modification ? ?Formal diagnosis of essential hypertension today.  Hydrochlorothiazide ordered as she had reported some intermittent edema.  We will reassess again in a month ? ?For her migraine headaches I have given  her Nurtec samples to have on hand since this does not increase risk of stroke.  May need to consider preventative medication if migraine headaches become more frequent.  Though at this time I do think that they may be reflective of uncontrolled hypertension ? ?No orders of the defined types were placed in this encounter. ? ?No orders of the defined types were placed in this encounter. ? ? ? ?Janora Norlander, DO ?[place] ?[phone] ? ?

## 2022-02-14 NOTE — Patient Instructions (Addendum)
Schedule your annual physical exam with fasting labs. ?We will be talking about colon cancer screening and other preventative issues during that visit. ? ?Hypertension, Adult ?Hypertension is another name for high blood pressure. High blood pressure forces your heart to work harder to pump blood. This can cause problems over time. ?There are two numbers in a blood pressure reading. There is a top number (systolic) over a bottom number (diastolic). It is best to have a blood pressure that is below 120/80. Healthy choices can help lower your blood pressure, or you may need medicine to help lower it. ?What are the causes? ?The cause of this condition is not known. Some conditions may be related to high blood pressure. ?What increases the risk? ?Smoking. ?Having type 2 diabetes mellitus, high cholesterol, or both. ?Not getting enough exercise or physical activity. ?Being overweight. ?Having too much fat, sugar, calories, or salt (sodium) in your diet. ?Drinking too much alcohol. ?Having long-term (chronic) kidney disease. ?Having a family history of high blood pressure. ?Age. Risk increases with age. ?Race. You may be at higher risk if you are African American. ?Gender. Men are at higher risk than women before age 18. After age 35, women are at higher risk than men. ?Having obstructive sleep apnea. ?Stress. ?What are the signs or symptoms? ?High blood pressure may not cause symptoms. Very high blood pressure (hypertensive crisis) may cause: ?Headache. ?Feelings of worry or nervousness (anxiety). ?Shortness of breath. ?Nosebleed. ?A feeling of being sick to your stomach (nausea). ?Throwing up (vomiting). ?Changes in how you see. ?Very bad chest pain. ?Seizures. ?How is this treated? ?This condition is treated by making healthy lifestyle changes, such as: ?Eating healthy foods. ?Exercising more. ?Drinking less alcohol. ?Your health care provider may prescribe medicine if lifestyle changes are not enough to get your blood  pressure under control, and if: ?Your top number is above 130. ?Your bottom number is above 80. ?Your personal target blood pressure may vary. ?Follow these instructions at home: ?Eating and drinking ? ?If told, follow the DASH eating plan. To follow this plan: ?Fill one half of your plate at each meal with fruits and vegetables. ?Fill one fourth of your plate at each meal with whole grains. Whole grains include whole-wheat pasta, brown rice, and whole-grain bread. ?Eat or drink low-fat dairy products, such as skim milk or low-fat yogurt. ?Fill one fourth of your plate at each meal with low-fat (lean) proteins. Low-fat proteins include fish, chicken without skin, eggs, beans, and tofu. ?Avoid fatty meat, cured and processed meat, or chicken with skin. ?Avoid pre-made or processed food. ?Eat less than 1,500 mg of salt each day. ?Do not drink alcohol if: ?Your doctor tells you not to drink. ?You are pregnant, may be pregnant, or are planning to become pregnant. ?If you drink alcohol: ?Limit how much you use to: ?0-1 drink a day for women. ?0-2 drinks a day for men. ?Be aware of how much alcohol is in your drink. In the [place] one drink equals one 12 oz bottle of beer (355 mL), one 5 oz glass of wine (148 mL), or one 1? oz glass of hard liquor (44 mL). ?Lifestyle ? ?Work with your doctor to stay at a healthy weight or to lose weight. Ask your doctor what the best weight is for you. ?Get at least 30 minutes of exercise most days of the week. This may include walking, swimming, or biking. ?Get at least 30 minutes of exercise that strengthens your muscles (resistance exercise) at  least 3 days a week. This may include lifting weights or doing Pilates. ?Do not use any products that contain nicotine or tobacco, such as cigarettes, e-cigarettes, and chewing tobacco. If you need help quitting, ask your doctor. ?Check your blood pressure at home as told by your doctor. ?Keep all follow-up visits as told by your doctor. This is  important. ?Medicines ?Take over-the-counter and prescription medicines only as told by your doctor. Follow directions carefully. ?Do not skip doses of blood pressure medicine. The medicine does not work as well if you skip doses. Skipping doses also puts you at risk for problems. ?Ask your doctor about side effects or reactions to medicines that you should watch for. ?Contact a doctor if you: ?Think you are having a reaction to the medicine you are taking. ?Have headaches that keep coming back (recurring). ?Feel dizzy. ?Have swelling in your ankles. ?Have trouble with your vision. ?Get help right away if you: ?Get a very bad headache. ?Start to feel mixed up (confused). ?Feel weak or numb. ?Feel faint. ?Have very bad pain in your: ?Chest. ?Belly (abdomen). ?Throw up more than once. ?Have trouble breathing. ?Summary ?Hypertension is another name for high blood pressure. ?High blood pressure forces your heart to work harder to pump blood. ?For most people, a normal blood pressure is less than 120/80. ?Making healthy choices can help lower blood pressure. If your blood pressure does not get lower with healthy choices, you may need to take medicine. ?This information is not intended to replace advice given to you by your health care provider. Make sure you discuss any questions you have with your health care provider. ?Document Revised: 07/25/2018 Document Reviewed: 07/25/2018 ?Elsevier Patient Education ? [place]. ? ?

## 2022-03-21 ENCOUNTER — Telehealth: Payer: Self-pay | Admitting: *Deleted

## 2022-03-21 ENCOUNTER — Encounter: Payer: Self-pay | Admitting: Family Medicine

## 2022-03-21 ENCOUNTER — Ambulatory Visit (INDEPENDENT_AMBULATORY_CARE_PROVIDER_SITE_OTHER): Payer: 59 | Admitting: Family Medicine

## 2022-03-21 VITALS — BP 130/92 | HR 77 | Temp 98.2°F | Ht 64.0 in | Wt 231.0 lb

## 2022-03-21 DIAGNOSIS — Z130 Encounter for screening for diseases of the blood and blood-forming organs and certain disorders involving the immune mechanism: Secondary | ICD-10-CM

## 2022-03-21 DIAGNOSIS — Z1211 Encounter for screening for malignant neoplasm of colon: Secondary | ICD-10-CM

## 2022-03-21 DIAGNOSIS — L309 Dermatitis, unspecified: Secondary | ICD-10-CM | POA: Diagnosis not present

## 2022-03-21 DIAGNOSIS — Z0001 Encounter for general adult medical examination with abnormal findings: Secondary | ICD-10-CM | POA: Diagnosis not present

## 2022-03-21 DIAGNOSIS — I10 Essential (primary) hypertension: Secondary | ICD-10-CM | POA: Diagnosis not present

## 2022-03-21 DIAGNOSIS — Z713 Dietary counseling and surveillance: Secondary | ICD-10-CM | POA: Diagnosis not present

## 2022-03-21 DIAGNOSIS — Z0000 Encounter for general adult medical examination without abnormal findings: Secondary | ICD-10-CM

## 2022-03-21 LAB — BAYER DCA HB A1C WAIVED: HB A1C (BAYER DCA - WAIVED): 5.4 % (ref 4.8–5.6)

## 2022-03-21 MED ORDER — WEGOVY 1.7 MG/0.75ML ~~LOC~~ SOAJ: 1.7000 mg | SUBCUTANEOUS | 0 refills | Status: DC

## 2022-03-21 MED ORDER — WEGOVY 0.25 MG/0.5ML ~~LOC~~ SOAJ: 0.2500 mg | SUBCUTANEOUS | 0 refills | Status: DC

## 2022-03-21 MED ORDER — WEGOVY 1 MG/0.5ML ~~LOC~~ SOAJ: 1.0000 mg | SUBCUTANEOUS | 0 refills | Status: DC

## 2022-03-21 MED ORDER — TRIAMCINOLONE ACETONIDE 0.1 % EX CREA: 1.0000 "application " | TOPICAL_CREAM | Freq: Two times a day (BID) | CUTANEOUS | 1 refills | Status: DC

## 2022-03-21 MED ORDER — WEGOVY 0.5 MG/0.5ML ~~LOC~~ SOAJ: 0.5000 mg | SUBCUTANEOUS | 0 refills | Status: DC

## 2022-03-21 NOTE — Progress Notes (Addendum)
? ?April Roman is a 46 y.o. female presents to office today for annual physical exam examination.   ? ?Concerns today include: ?1. Hypertension associated with morbid obesity, migraine headaches ?Patient was seen 1 month ago.  At that time her blood pressure was grossly uncontrolled at 160/101.  She was advised to continue with the weight loss program she had just recently enrolled then.  Hydrochlorothiazide 25 mg daily ordered.  She is here for recheck and physical.  Migraine headaches have been absent since her last visit.  She has not had to use the Nurtec, which was sampled to her last visit.  Her blood pressures have been running 130s over 80s to 90s.  No chest pain, shortness of breath or edema reported.  She is very interested in losing weight through medication at this point.  She has changed her dietary habits quite a bit through the bariatric clinic and has subsequently lost 7 pounds but the cost of going is quite cumbersome at $100 per visit not including the B12 injection which she pays for separately.  She has no contraindications to GLP class and would be interested in this if available to her. ? ?Weight loss attempts include weight watchers, 20 to 30 pound weight loss; Octavia, 20 pound weight loss; phentermine 30 pound weight loss. ? ?2.  Rash ?Patient reports a rash on her chest.  This is extremely itchy and she has been utilizing topical Benadryl.  Was told it might be eczema, which her son has. ? ?Last colonoscopy: wants cologuard ?Last mammogram: UTD ?Last pap smear: UTD ?Refills needed today: na ?Immunizations needed: Tetanus shot but declines ?Immunization History  ?Administered Date(s) Administered  ? Influenza,inj,quad, With Preservative 09/20/2019  ? PFIZER(Purple Top)SARS-COV-2 Vaccination 02/20/2020, 03/14/2020  ? ? ? ?Past Medical History:  ?Diagnosis Date  ? Allergy   ? Arm vein blood clot, left   ? COVID-19   ? Environmental allergies   ? Factor 5 Leiden mutation, heterozygous ([place])    ? Vision abnormalities   ? ?Social History  ? ?Socioeconomic History  ? Marital status: Married  ?  Spouse name: April Roman   ? Number of children: 2  ? Years of education: Not on file  ? Highest education level: Not on file  ?Occupational History  ?  Employer: [employer]  ?Tobacco Use  ? Smoking status: Never  ? Smokeless tobacco: Never  ?Vaping Use  ? Vaping Use: Never used  ?Substance and Sexual Activity  ? Alcohol use: Yes  ?  Comment: occ  ? Drug use: No  ? Sexual activity: Yes  ?  Birth control/protection: None  ?Other Topics Concern  ? Not on file  ?Social History Narrative  ? Drinks 20-40 oz caffeine daily.  ? ?Social Determinants of Health  ? ?Financial Resource Strain: Not on file  ?Food Insecurity: Not on file  ?Transportation Needs: Not on file  ?Physical Activity: Not on file  ?Stress: Not on file  ?Social Connections: Not on file  ?Intimate Partner Violence: Not on file  ? ?History reviewed. No pertinent surgical history. ?Family History  ?Problem Relation Age of Onset  ? Arthritis Mother   ? Hypertension Mother   ? Diabetes Father   ? Heart disease Father   ? Hypertension Father   ? Cancer Father   ?     esophogeal   ? Psoriasis Maternal Aunt   ? Cirrhosis Maternal Aunt   ? Dementia Maternal Grandmother   ? Heart disease Maternal Grandfather   ?  Alzheimer's disease Paternal Grandmother   ? Heart attack Paternal Grandfather   ?     46s   ? Cancer Maternal Aunt   ?     breast   ? ? ?Current Outpatient Medications:  ?  clindamycin (CLEOCIN-T) 1 % external solution, Apply topically 2 (two) times daily., Disp: 30 mL, Rfl: 5 ?  Fexofenadine HCl (ALLERGY 24-HR PO), Take by mouth., Disp: , Rfl:  ?  hydrochlorothiazide (HYDRODIURIL) 25 MG tablet, Take 1 tablet (25 mg total) by mouth daily., Disp: 90 tablet, Rfl: 3 ?  Rimegepant Sulfate (NURTEC) 75 MG TBDP, Dissolve 1 tablet in mouth ONCE if needed for migraine headache, Disp: 4 tablet, Rfl: 0 ? ?No Known Allergies  ? ?ROS: ?Review of Systems ?Pertinent items  noted in HPI and remainder of comprehensive ROS otherwise negative.   ? ?Physical exam ?BP (!) 136/97   Pulse 77   Temp 98.2 ?F (36.8 ?C)   Ht _0  (1.626 m)   Wt 231 lb (104.8 kg)   SpO2 98%   BMI 39.65 kg/m?  ?General appearance: alert, cooperative, appears stated age, and morbidly obese ?Head: Normocephalic, without obvious abnormality, atraumatic ?Eyes: negative findings: lids and lashes normal, conjunctivae and sclerae normal, corneas clear, pupils equal, round, reactive to light and accomodation, and visual fields full to confrontation ?Ears: normal TM's and external ear canals both ears ?Nose: Nares normal. Septum midline. Mucosa normal. No drainage or sinus tenderness. ?Throat: lips, mucosa, and tongue normal; teeth and gums normal ?Neck: no adenopathy, no carotid bruit, supple, symmetrical, trachea midline, and thyroid not enlarged, symmetric, no tenderness/mass/nodules ?Back: symmetric, no curvature. ROM normal. No CVA tenderness. ?Lungs: clear to auscultation bilaterally ?Heart: regular rate and rhythm, S1, S2 normal, no murmur, click, rub or gallop ?Abdomen: soft, non-tender; bowel sounds normal; no masses,  no organomegaly ?Extremities: extremities normal, atraumatic, no cyanosis or edema ?Pulses: 2+ and symmetric ?Skin: Skin color, texture, turgor normal.  She has a small patch of dermatitis noted along the upper chest area.  No skin breakdown or exudates ?Lymph nodes: Cervical, supraclavicular, and axillary nodes normal. ?Neurologic: Grossly normal ? ?[place] Office Visit from 03/21/2022 in [place]  ?PHQ-2 Total Score 0  ? ?  ? ? ?Assessment/ Plan: ?April Roman here for annual physical exam.  ? ?Annual physical exam ? ?Morbid obesity ([place]) - Plan: CMP14+EGFR, Lipid Panel, TSH, Bayer DCA Hb A1c Waived, Semaglutide-Weight Management (WEGOVY) 0.5 MG/0.5ML SOAJ, Semaglutide-Weight Management (WEGOVY) 1 MG/0.5ML SOAJ, Semaglutide-Weight Management (WEGOVY) 1.7  MG/0.75ML SOAJ, Semaglutide-Weight Management (WEGOVY) 0.25 MG/0.5ML SOAJ ? ?Weight loss counseling, encounter for ? ?Essential hypertension - Plan: CMP14+EGFR, Semaglutide-Weight Management (WEGOVY) 0.5 MG/0.5ML SOAJ, Semaglutide-Weight Management (WEGOVY) 1 MG/0.5ML SOAJ, Semaglutide-Weight Management (WEGOVY) 1.7 MG/0.75ML SOAJ, Semaglutide-Weight Management (WEGOVY) 0.25 MG/0.5ML SOAJ ? ?Screening, anemia, deficiency, iron - Plan: CBC ? ?Screen for colon cancer - Plan: Cologuard ? ?Dermatitis - Plan: triamcinolone cream (KENALOG) 0.1 % ? ?We discussed options for weight loss including GLP, phentermine, Vyvanse.  I think she would be an excellent candidate for the [facility] or Saxenda treatment.  She is going to look into whether or not her insurance company will cover this and I will place a sample upfront if so.  We detailed her weight loss history and attempts above.  She has no apparent contraindications to use of GLP class.  Would like to see her back in 4 months for recheck of this weight progress ? ?Her hypertension is controlled at home for the  most part but not showing controlled here.  I am going to wait to make any further adjustments.  For now she will continue the hydrochlorothiazide. ? ?Check CBC.  Cologuard ordered.  No apparent contraindications to use ? ?For the dermatitis, triamcinolone cream has been prescribed.  Use up to twice daily for 7 days if needed ? ?Shatira Dobosz M. Jamise Pentland, DO ? ? ? ? ? ?

## 2022-03-21 NOTE — Patient Instructions (Addendum)
You had labs performed today.  You will be contacted with the results of the labs once they are available, usually in the next 3 business days for routine lab work.  If you have an active my chart account, they will be released to your MyChart.  If you prefer to have these labs released to you via telephone, please let us know.  Preventive Care 40-47 Years Old, Female Preventive care refers to lifestyle choices and visits with your health care provider that can promote health and wellness. Preventive care visits are also called wellness exams. What can I expect for my preventive care visit? Counseling Your health care provider may ask you questions about your: Medical history, including: Past medical problems. Family medical history. Pregnancy history. Current health, including: Menstrual cycle. Method of birth control. Emotional well-being. Home life and relationship well-being. Sexual activity and sexual health. Lifestyle, including: Alcohol, nicotine or tobacco, and drug use. Access to firearms. Diet, exercise, and sleep habits. Work and work environment. Sunscreen use. Safety issues such as seatbelt and bike helmet use. Physical exam Your health care provider will check your: Height and weight. These may be used to calculate your BMI (body mass index). BMI is a measurement that tells if you are at a healthy weight. Waist circumference. This measures the distance around your waistline. This measurement also tells if you are at a healthy weight and may help predict your risk of certain diseases, such as type 2 diabetes and high blood pressure. Heart rate and blood pressure. Body temperature. Skin for abnormal spots. What immunizations do I need?  Vaccines are usually given at various ages, according to a schedule. Your health care provider will recommend vaccines for you based on your age, medical history, and lifestyle or other factors, such as travel or where you work. What tests  do I need? Screening Your health care provider may recommend screening tests for certain conditions. This may include: Lipid and cholesterol levels. Diabetes screening. This is done by checking your blood sugar (glucose) after you have not eaten for a while (fasting). Pelvic exam and Pap test. Hepatitis B test. Hepatitis C test. HIV (human immunodeficiency virus) test. STI (sexually transmitted infection) testing, if you are at risk. Lung cancer screening. Colorectal cancer screening. Mammogram. Talk with your health care provider about when you should start having regular mammograms. This may depend on whether you have a family history of breast cancer. BRCA-related cancer screening. This may be done if you have a family history of breast, ovarian, tubal, or peritoneal cancers. Bone density scan. This is done to screen for osteoporosis. Talk with your health care provider about your test results, treatment options, and if necessary, the need for more tests. Follow these instructions at home: Eating and drinking  Eat a diet that includes fresh fruits and vegetables, whole grains, lean protein, and low-fat dairy products. Take vitamin and mineral supplements as recommended by your health care provider. Do not drink alcohol if: Your health care provider tells you not to drink. You are pregnant, may be pregnant, or are planning to become pregnant. If you drink alcohol: Limit how much you have to 0-1 drink a day. Know how much alcohol is in your drink. In the [place] one drink equals one 12 oz bottle of beer (355 mL), one 5 oz glass of wine (148 mL), or one 1 oz glass of hard liquor (44 mL). Lifestyle Brush your teeth every morning and night with fluoride toothpaste. Floss one time each day.   Exercise for at least 30 minutes 5 or more days each week. Do not use any products that contain nicotine or tobacco. These products include cigarettes, chewing tobacco, and vaping devices, such as  e-cigarettes. If you need help quitting, ask your health care provider. Do not use drugs. If you are sexually active, practice safe sex. Use a condom or other form of protection to prevent STIs. If you do not wish to become pregnant, use a form of birth control. If you plan to become pregnant, see your health care provider for a prepregnancy visit. Take aspirin only as told by your health care provider. Make sure that you understand how much to take and what form to take. Work with your health care provider to find out whether it is safe and beneficial for you to take aspirin daily. Find healthy ways to manage stress, such as: Meditation, yoga, or listening to music. Journaling. Talking to a trusted person. Spending time with friends and family. Minimize exposure to UV radiation to reduce your risk of skin cancer. Safety Always wear your seat belt while driving or riding in a vehicle. Do not drive: If you have been drinking alcohol. Do not ride with someone who has been drinking. When you are tired or distracted. While texting. If you have been using any mind-altering substances or drugs. Wear a helmet and other protective equipment during sports activities. If you have firearms in your house, make sure you follow all gun safety procedures. Seek help if you have been physically or sexually abused. What's next? Visit your health care provider once a year for an annual wellness visit. Ask your health care provider how often you should have your eyes and teeth checked. Stay up to date on all vaccines. This information is not intended to replace advice given to you by your health care provider. Make sure you discuss any questions you have with your health care provider. Document Revised: 05/12/2021 Document Reviewed: 05/12/2021 Elsevier Patient Education  2023 Elsevier Inc.   

## 2022-03-21 NOTE — Telephone Encounter (Signed)
(  Key: [id number]) ?Rx #: [id number] ?Wegovy 1.7MG/0.75ML auto-injectors ? ?  ?Form ?[employer] Electronic PA Form [phone] NCPDP) ? ?Sent to plan ?

## 2022-03-21 NOTE — Addendum Note (Signed)
Addended by: Janora Norlander on: 03/21/2022 04:47 PM ? ? Modules accepted: Orders ? ?

## 2022-03-22 LAB — CMP14+EGFR
ALT: 44 IU/L — ABNORMAL HIGH (ref 0–32)
AST: 25 IU/L (ref 0–40)
Albumin/Globulin Ratio: 1.3 (ref 1.2–2.2)
Albumin: 4 g/dL (ref 3.8–4.8)
Alkaline Phosphatase: 125 IU/L — ABNORMAL HIGH (ref 44–121)
BUN/Creatinine Ratio: 22 (ref 9–23)
BUN: 17 mg/dL (ref 6–24)
Bilirubin Total: <0.2 mg/dL (ref 0.0–1.2)
CO2: 24 mmol/L (ref 20–29)
Calcium: 9.3 mg/dL (ref 8.7–10.2)
Chloride: 101 mmol/L (ref 96–106)
Creatinine, Ser: 0.77 mg/dL (ref 0.57–1.00)
Globulin, Total: 3 g/dL (ref 1.5–4.5)
Glucose: 90 mg/dL (ref 70–99)
Potassium: 4.1 mmol/L (ref 3.5–5.2)
Sodium: 140 mmol/L (ref 134–144)
Total Protein: 7 g/dL (ref 6.0–8.5)
eGFR: 96 mL/min/{1.73_m2} (ref >59)

## 2022-03-22 LAB — CBC
Hematocrit: 40.5 % (ref 34.0–46.6)
Hemoglobin: 13.3 g/dL (ref 11.1–15.9)
MCH: 28.4 pg (ref 26.6–33.0)
MCHC: 32.8 g/dL (ref 31.5–35.7)
MCV: 86 fL (ref 79–97)
Platelets: 421 10*3/uL (ref 150–450)
RBC: 4.69 x10E6/uL (ref 3.77–5.28)
RDW: 13.3 % (ref 11.7–15.4)
WBC: 7.6 10*3/uL (ref 3.4–10.8)

## 2022-03-22 LAB — LIPID PANEL
Chol/HDL Ratio: 3.6 ratio (ref 0.0–4.4)
Cholesterol, Total: 175 mg/dL (ref 100–199)
HDL: 48 mg/dL (ref >39)
LDL Chol Calc (NIH): 108 mg/dL — ABNORMAL HIGH (ref 0–99)
Triglycerides: 104 mg/dL (ref 0–149)
VLDL Cholesterol Cal: 19 mg/dL (ref 5–40)

## 2022-03-22 LAB — TSH: TSH: 0.973 u[IU]/mL (ref 0.450–4.500)

## 2022-03-22 NOTE — Telephone Encounter (Signed)
As long as you remain covered by your prescription drug plan and there are no changes to your ?plan benefits, this request is approved from 03/21/2022 to 10/21/2022. When this approval ?expires, please speak to your doctor about your treatment. ?

## 2022-04-02 LAB — COLOGUARD: COLOGUARD: NEGATIVE

## 2022-04-14 ENCOUNTER — Other Ambulatory Visit: Payer: Self-pay | Admitting: Family Medicine

## 2022-04-14 DIAGNOSIS — I10 Essential (primary) hypertension: Secondary | ICD-10-CM

## 2022-04-14 NOTE — Telephone Encounter (Signed)
Are you sure she is requesting refill on 0.66m?  We had sent in ALL 4 months of titration dose.  Please check with pt to confirm

## 2022-04-16 ENCOUNTER — Other Ambulatory Visit: Payer: Self-pay | Admitting: Family Medicine

## 2022-04-16 DIAGNOSIS — I10 Essential (primary) hypertension: Secondary | ICD-10-CM

## 2022-04-18 MED ORDER — WEGOVY 0.5 MG/0.5ML ~~LOC~~ SOAJ: 0.5000 mg | SUBCUTANEOUS | 0 refills | Status: DC

## 2022-04-19 ENCOUNTER — Ambulatory Visit: Payer: 59 | Admitting: Family Medicine

## 2022-04-20 ENCOUNTER — Other Ambulatory Visit: Payer: Self-pay | Admitting: Family Medicine

## 2022-04-20 DIAGNOSIS — I10 Essential (primary) hypertension: Secondary | ICD-10-CM

## 2022-04-20 MED ORDER — WEGOVY 0.25 MG/0.5ML ~~LOC~~ SOAJ: 0.5000 mg | SUBCUTANEOUS | 0 refills | Status: AC

## 2022-06-01 ENCOUNTER — Encounter: Payer: Self-pay | Admitting: Family Medicine

## 2023-01-31 ENCOUNTER — Encounter: Payer: Self-pay | Admitting: Family Medicine

## 2023-03-13 ENCOUNTER — Encounter (INDEPENDENT_AMBULATORY_CARE_PROVIDER_SITE_OTHER): Payer: Self-pay

## 2023-03-13 ENCOUNTER — Encounter: Payer: Self-pay | Admitting: Plastic Surgery

## 2023-03-13 ENCOUNTER — Encounter: Payer: Self-pay | Admitting: *Deleted

## 2023-03-13 ENCOUNTER — Ambulatory Visit (INDEPENDENT_AMBULATORY_CARE_PROVIDER_SITE_OTHER): Payer: Self-pay | Admitting: Plastic Surgery

## 2023-03-13 VITALS — Wt 237.2 lb

## 2023-03-13 DIAGNOSIS — Z719 Counseling, unspecified: Secondary | ICD-10-CM | POA: Insufficient documentation

## 2023-03-13 NOTE — Progress Notes (Signed)
Patient ID: April Roman, female    DOB: 11-Feb-1976, 47 y.o.   MRN: [id number]   Chief Complaint  Patient presents with   Cosmetic Visit    The patient is a 47 year old female here for evaluation of her face.  She has several areas of hyperpigmentation and what looked to be seborrheic keratoses.  She has had them lasered in the past but that was probably about 4 years ago.  She is having to use quite a bit of make-up to get the areas covered.  She is a Fitzpatrick 2.  She is no longer sun tanning but did quite a bit in her early years.  She has a lot of seborrheic keratoses on her arms as well.    Review of Systems  Constitutional: Negative.   HENT: Negative.    Eyes: Negative.   Respiratory: Negative.    Cardiovascular: Negative.   Endocrine: Negative.   Genitourinary: Negative.     Past Medical History:  Diagnosis Date   Allergy    Arm vein blood clot, left    COVID-19    Environmental allergies    Factor 5 Leiden mutation, heterozygous    Vision abnormalities     No past surgical history on file.    Current Outpatient Medications:    clindamycin (CLEOCIN-T) 1 % external solution, Apply topically 2 (two) times daily., Disp: 30 mL, Rfl: 5   Fexofenadine HCl (ALLERGY 24-HR PO), Take by mouth., Disp: , Rfl:    hydrochlorothiazide (HYDRODIURIL) 25 MG tablet, Take 1 tablet (25 mg total) by mouth daily., Disp: 90 tablet, Rfl: 3   Rimegepant Sulfate (NURTEC) 75 MG TBDP, Dissolve 1 tablet in mouth ONCE if needed for migraine headache (Patient not taking: Reported on 03/13/2023), Disp: 4 tablet, Rfl: 0   Semaglutide-Weight Management (WEGOVY) 1.7 MG/0.75ML SOAJ, Inject 1.7 mg into the skin every 7 (seven) days. Month#4 (Patient not taking: Reported on 03/13/2023), Disp: 3 mL, Rfl: 0   Objective:   There were no vitals filed for this visit.  Physical Exam Constitutional:      Appearance: Normal appearance.  HENT:     Head: Normocephalic and atraumatic.  Cardiovascular:      Rate and Rhythm: Normal rate.  Pulmonary:     Effort: Pulmonary effort is normal.  Musculoskeletal:        General: No swelling or deformity.  Skin:    Coloration: Skin is not jaundiced.     Findings: Lesion present. No bruising.  Neurological:     Mental Status: She is alert and oriented to person, place, and time.  Psychiatric:        Mood and Affect: Mood normal.        Behavior: Behavior normal.        Thought Content: Thought content normal.        Judgment: Judgment normal.     Assessment & Plan:  Encounter for counseling  The patient is a really good candidate for skin care regiment.  I introduced her to [place] and we will get her in for consult.  I am also going to send her the information for the BBL and the halo.  I think she would be a good candidate for that including the TRL.  I think that that will help with decreasing the pigment and the raised area.  The patient will look things over and let us know how she would like to proceed.  Pictures were obtained of the patient and  placed in the chart with the patient's or guardian's permission.  [place], DO

## 2023-03-27 ENCOUNTER — Encounter (INDEPENDENT_AMBULATORY_CARE_PROVIDER_SITE_OTHER): Payer: 59

## 2023-03-27 DIAGNOSIS — Z719 Counseling, unspecified: Secondary | ICD-10-CM

## 2023-05-29 ENCOUNTER — Encounter (INDEPENDENT_AMBULATORY_CARE_PROVIDER_SITE_OTHER): Payer: Self-pay

## 2023-05-29 DIAGNOSIS — Z719 Counseling, unspecified: Secondary | ICD-10-CM

## 2023-07-10 ENCOUNTER — Ambulatory Visit (INDEPENDENT_AMBULATORY_CARE_PROVIDER_SITE_OTHER): Payer: Self-pay

## 2023-07-10 DIAGNOSIS — Z719 Counseling, unspecified: Secondary | ICD-10-CM

## 2023-07-10 MED ORDER — HYDROQUINONE 4 % EX CREA: TOPICAL_CREAM | Freq: Two times a day (BID) | CUTANEOUS | 0 refills | Status: DC

## 2023-07-10 NOTE — Progress Notes (Signed)
Prescription placed for Hydroquinone.

## 2023-07-11 ENCOUNTER — Other Ambulatory Visit: Payer: Self-pay | Admitting: Family Medicine

## 2023-07-11 DIAGNOSIS — Z1231 Encounter for screening mammogram for malignant neoplasm of breast: Secondary | ICD-10-CM

## 2023-07-12 ENCOUNTER — Ambulatory Visit
Admission: RE | Admit: 2023-07-12 | Discharge: 2023-07-12 | Disposition: A | Discharge status: Home / Self Care | Payer: 59 | Source: Ambulatory Visit

## 2023-07-12 DIAGNOSIS — Z1231 Encounter for screening mammogram for malignant neoplasm of breast: Secondary | ICD-10-CM

## 2023-07-14 ENCOUNTER — Other Ambulatory Visit: Payer: Self-pay | Admitting: Family Medicine

## 2023-07-14 DIAGNOSIS — R928 Other abnormal and inconclusive findings on diagnostic imaging of breast: Secondary | ICD-10-CM

## 2023-07-21 ENCOUNTER — Ambulatory Visit: Admission: RE | Admit: 2023-07-21 | Payer: 59 | Source: Ambulatory Visit

## 2023-07-21 DIAGNOSIS — R928 Other abnormal and inconclusive findings on diagnostic imaging of breast: Secondary | ICD-10-CM

## 2023-09-29 ENCOUNTER — Encounter: Payer: Self-pay | Admitting: Family Medicine

## 2023-09-29 ENCOUNTER — Ambulatory Visit (INDEPENDENT_AMBULATORY_CARE_PROVIDER_SITE_OTHER): Payer: 59 | Admitting: Family Medicine

## 2023-09-29 VITALS — BP 192/108 | HR 88 | Temp 98.5°F | Ht 64.0 in | Wt 243.0 lb

## 2023-09-29 DIAGNOSIS — D6851 Activated protein C resistance: Secondary | ICD-10-CM

## 2023-09-29 DIAGNOSIS — Z0000 Encounter for general adult medical examination without abnormal findings: Secondary | ICD-10-CM

## 2023-09-29 DIAGNOSIS — Z713 Dietary counseling and surveillance: Secondary | ICD-10-CM | POA: Diagnosis not present

## 2023-09-29 DIAGNOSIS — I10 Essential (primary) hypertension: Secondary | ICD-10-CM | POA: Diagnosis not present

## 2023-09-29 DIAGNOSIS — Z0001 Encounter for general adult medical examination with abnormal findings: Secondary | ICD-10-CM

## 2023-09-29 DIAGNOSIS — R519 Headache, unspecified: Secondary | ICD-10-CM

## 2023-09-29 MED ORDER — HYDROCHLOROTHIAZIDE 25 MG PO TABS: 25.0000 mg | ORAL_TABLET | Freq: Every day | ORAL | 3 refills | Status: DC

## 2023-09-29 MED ORDER — OZEMPIC (2 MG/DOSE) 8 MG/3ML ~~LOC~~ SOPN: PEN_INJECTOR | SUBCUTANEOUS | Status: DC

## 2023-09-29 MED ORDER — FLUTICASONE PROPIONATE 50 MCG/ACT NA SUSP: 2.0000 | Freq: Every day | NASAL | 6 refills | Status: AC

## 2023-09-29 NOTE — Progress Notes (Signed)
April Roman is a 47 y.o. female presents to office today for annual physical exam examination.    Concerns today include: 1.  Hypertension associated with morbid obesity Interested in compounded GLP.  She was previously somewhat successful on 0.25 mg but her insurance stopped paying for it so she was never able to pursue higher dosing.  She admits that she has been off of her hydrochlorothiazide for months.  She had discontinued it while going on Wegovy in hopes that she would have a lost weight and no longer needed.  She admits that she has had increased sinus pressure headache and is not sure if this is related to blood pressure or sinuses.  She is been using Sudafed and ibuprofen but no allergy nasal sprays or antihistamines.  No blurred vision, double vision, difficulty swallowing, difficulty with balance or other strokelike symptoms  Occupation: works from home 2 days per week and in office the remainder, Marital status: married, Substance use: none Health Maintenance Due  Topic Date Due   COVID-19 Vaccine (3 - 2023-24 season) 07/30/2023   Refills needed today: all  Immunization History  Administered Date(s) Administered   Influenza,inj,quad, With Preservative 09/20/2019   PFIZER(Purple Top)SARS-COV-2 Vaccination 02/20/2020, 03/14/2020   Past Medical History:  Diagnosis Date   Allergy    Arm vein blood clot, left    COVID-19    Environmental allergies    Factor 5 Leiden mutation, heterozygous (HCC)    Vision abnormalities    Social History   Socioeconomic History   Marital status: Married    Spouse name: April Roman    Number of children: 2   Years of education: Not on file   Highest education level: Not on file  Occupational History    Employer: [employer]  Tobacco Use   Smoking status: Never   Smokeless tobacco: Never  Vaping Use   Vaping status: Never Used  Substance and Sexual Activity   Alcohol use: Yes    Comment: occ   Drug use: No   Sexual activity: Yes     Birth control/protection: None  Other Topics Concern   Not on file  Social History Narrative   Drinks 20-40 oz caffeine daily.   Social Determinants of Health   Financial Resource Strain: Low Risk  (01/29/2020)   Received from [facility], [facility]   Overall Financial Resource Strain (CARDIA)    Difficulty of Paying Living Expenses: Not hard at all  Food Insecurity: No Food Insecurity (01/29/2020)   Received from [facility], [facility]   Hunger Vital Sign    Worried About Running Out of Food in the Last Year: Never true    Ran Out of Food in the Last Year: Never true  Transportation Needs: No Transportation Needs (01/29/2020)   Received from [facility], [facility]   [facility] - Transportation    Lack of Transportation (Medical): No    Lack of Transportation (Non-Medical): No  Physical Activity: Inactive (01/29/2020)   Received from [facility], [facility]   Exercise Vital Sign    Days of Exercise per Week: 0 days    Minutes of Exercise per Session: 0 min  Stress: No Stress Concern Present (01/29/2020)   Received from [facility], [facility] of Occupational Health - Occupational Stress Questionnaire    Feeling of Stress : Not at all  Social Connections: Moderately Integrated (01/29/2020)   Received from [facility], [facility]   Social Connection and Isolation Panel [NHANES]    Frequency of Communication with Friends and Family: More than three times a week    Frequency of Social Gatherings with Friends and Family: Three times a week    Attends Religious Services: Never    Active Member of Clubs or Organizations: Yes    Attends Banker Meetings: More than 4 times per year    Marital Status: Married  Catering manager Violence: Not At Risk (01/29/2020)   Received from [facility], [facility]   Humiliation, Afraid, Rape, and Kick questionnaire    Fear of Current or Ex-Partner: No     Emotionally Abused: No    Physically Abused: No    Sexually Abused: No   No past surgical history on file. Family History  Problem Relation Age of Onset   Arthritis Mother    Hypertension Mother    Diabetes Father    Heart disease Father    Hypertension Father    Cancer Father        esophogeal    Psoriasis Maternal Aunt    Cirrhosis Maternal Aunt    Dementia Maternal Grandmother    Heart disease Maternal Grandfather    Alzheimer's disease Paternal Grandmother    Heart attack Paternal Grandfather        38s    Cancer Maternal Aunt        breast     Current Outpatient Medications:    clindamycin (CLEOCIN-T) 1 % external solution, Apply topically 2 (two) times daily., Disp: 30 mL, Rfl: 5   Fexofenadine HCl (ALLERGY 24-HR PO), Take by mouth., Disp: , Rfl:    hydroquinone 4 % cream, Apply topically 2 (two) times daily., Disp: 28.35 g, Rfl: 0   Rimegepant Sulfate (NURTEC) 75 MG TBDP, Dissolve 1 tablet in mouth ONCE if needed for migraine headache, Disp: 4 tablet, Rfl: 0   Semaglutide, 2 MG/DOSE, (OZEMPIC, 2 MG/DOSE,) 8 MG/3ML SOPN, Titrate monthly as directed. Eden drug compound, Disp: , Rfl:    hydrochlorothiazide (HYDRODIURIL) 25 MG tablet, Take 1 tablet (25 mg total) by mouth daily., Disp: 90 tablet, Rfl: 3  No Known Allergies   ROS: Review of Systems A comprehensive review of systems was negative except for: Ears, nose, mouth, throat, and face: positive for sinus pressure Integument/breast: positive for skin lesion(s) and seeing a plastic surgeon Neurological: positive for headaches    Physical exam BP (!) 192/108   Pulse 88   Temp 98.5 F (36.9 C)   Ht 5\' 4"  (1.626 m)   Wt 243 lb (110.2 kg)   LMP 09/20/2023   SpO2 96%   BMI 41.71 kg/m  General appearance: alert, cooperative, appears stated age, no distress, and morbidly obese Head: Normocephalic, without obvious abnormality, atraumatic Eyes: negative findings: lids and lashes normal, conjunctivae and sclerae  normal, corneas clear, and pupils equal, round, reactive to light and accomodation Ears: normal TM's and external ear canals both ears Nose: Nares normal. Septum midline. Mucosa normal. No drainage or sinus tenderness. Throat: lips, mucosa, and tongue normal; teeth and gums normal Neck: no adenopathy, supple, symmetrical, trachea midline, and thyroid not enlarged, symmetric, no tenderness/mass/nodules Back: symmetric, no curvature. ROM normal. No CVA tenderness. Lungs: clear to auscultation bilaterally Heart: regular rate and rhythm, S1, S2 normal, no murmur, click, rub or gallop Abdomen: soft, non-tender; bowel sounds normal; no masses,  no organomegaly Extremities: extremities normal, atraumatic, no cyanosis or edema Pulses: 2+  and symmetric Skin:  Hyperpigmentation noted along the left cheek Lymph nodes: Cervical, supraclavicular, and axillary nodes normal. Neurologic: Grossly normal      09/29/2023    1:43 PM 03/21/2022   11:10 AM 02/14/2022   10:34 AM  Depression screen PHQ 2/9  Decreased Interest 0 0 0  Down, Depressed, Hopeless 0 0 0  PHQ - 2 Score 0 0 0  Altered sleeping 0    Tired, decreased energy 0    Change in appetite 0    Feeling bad or failure about yourself  0    Trouble concentrating 0    Moving slowly or fidgety/restless 0    Suicidal thoughts 0    PHQ-9 Score 0    Difficult doing work/chores Not difficult at all        09/29/2023    1:43 PM 03/21/2022   11:10 AM  GAD 7 : Generalized Anxiety Score  Nervous, Anxious, on Edge 0 0  Control/stop worrying 0 0  Worry too much - different things 0 0  Trouble relaxing 0 0  Restless 0 0  Easily annoyed or irritable 0 0  Afraid - awful might happen 0 0  Total GAD 7 Score 0 0  Anxiety Difficulty Not difficult at all Not difficult at all     Assessment/ Plan: Rochele Pages here for annual physical exam.   Annual physical exam  Factor V Leiden (HCC) - Plan: CBC with Differential/Platelet  Essential  hypertension - Plan: hydrochlorothiazide (HYDRODIURIL) 25 MG tablet, CMP14+EGFR  Morbid obesity (HCC) - Plan: Semaglutide, 2 MG/DOSE, (OZEMPIC, 2 MG/DOSE,) 8 MG/3ML SOPN, CMP14+EGFR, Lipid panel, Bayer DCA Hb A1c Waived, TSH, VITAMIN D 25 Hydroxy (Vit-D Deficiency, Fractures)  Weight loss counseling, encounter for  Sinus headache - Plan: fluticasone (FLONASE) 50 MCG/ACT nasal spray  Wants to hold off on tetanus shot  Check fasting labs.  Patient will schedule lab appointment to be collected in the next couple of weeks and then follow-up with me in 2 months to follow-up on both blood pressure and weight.  I have sent the prescription for compounded semaglutide to pharmacy.  Previously tolerated this medication without difficulty.  I will fax this to [facility]  Resume use of hydrochlorothiazide given uncontrolled blood pressure.  She was given blood pressure log and encouraged to monitor.  Again we will see her closely back for ongoing surveillance.  May need to consider adding ARB if blood pressure remains above 140/90  Check CBC given history of factor V Leyden.  Not currently anticoagulated  Trial of Flonase for sinus headache though headache is likely due to uncontrolled blood pressure.  Hopefully will see improvement as her blood pressure improves.  Discussed avoiding things like Sudafed and oral NSAIDs when able  Counseled on healthy lifestyle choices, including diet (rich in fruits, vegetables and lean meats and low in salt and simple carbohydrates) and exercise (at least 30 minutes of moderate physical activity daily).  Patient to follow up 46m  Arlee Bossard M. Nadine Counts, DO

## 2023-09-29 NOTE — Patient Instructions (Signed)
Tips for success with Wegovy (and by success, how not to be super sick on your stomach): Eat small meals AVOID heavy foods (fried/ high in carbs like bread, pasta, rice) AVOID carbonated beverages (soda/ beer, as these can increase bloating) DOUBLE your water intake (will help you avoid constipation/ dehydration)  ZOXWRU CAN cause: Nausea Abdominal pain Increased acid reflux (sometimes presents as "sour burps") Constipation OR Diarrhea Fatigue (especially when you first start it)

## 2023-10-09 ENCOUNTER — Ambulatory Visit (INDEPENDENT_AMBULATORY_CARE_PROVIDER_SITE_OTHER): Payer: Self-pay

## 2023-10-13 ENCOUNTER — Ambulatory Visit: Payer: 59

## 2023-10-13 DIAGNOSIS — D6851 Activated protein C resistance: Secondary | ICD-10-CM

## 2023-10-13 DIAGNOSIS — I10 Essential (primary) hypertension: Secondary | ICD-10-CM

## 2023-10-13 LAB — BAYER DCA HB A1C WAIVED: HB A1C (BAYER DCA - WAIVED): 5.5 % (ref 4.8–5.6)

## 2023-10-13 NOTE — Progress Notes (Signed)
Patient came in for BP check and brought in home log. BP in office was 138/90. BP at home this morning was 120/89.     BP at Home Log 09/30/23  AM 152/98  PM  145/95 10/01/23  AM 158/94  PM  162/93 10/02/23  AM 148/97  PM  167/108 10/03/23  AM  151/106 10/04/23  AM 144/87  PM 139/94 10/05/23  AM 146/92  PM 136/93          10/06/23  AM 129/94 10/07/23                      PM 124/94 10/08/23                    PM 123/85 10/09/23  AM 124/87 10/10/23  AM 118/83  PM 127/80 10/11/23  AM 120/86  PM 131/84 10/12/23 AM 111/89   PM 130/95 10/13/23  AM 120/89

## 2023-10-14 LAB — CMP14+EGFR
ALT: 34 IU/L — ABNORMAL HIGH (ref 0–32)
AST: 26 [IU]/L (ref 0–40)
Albumin: 4.1 g/dL (ref 3.9–4.9)
Alkaline Phosphatase: 133 IU/L — ABNORMAL HIGH (ref 44–121)
BUN/Creatinine Ratio: 19 (ref 9–23)
BUN: 19 mg/dL (ref 6–24)
Bilirubin Total: 0.3 mg/dL (ref 0.0–1.2)
CO2: 21 mmol/L (ref 20–29)
Calcium: 8.9 mg/dL (ref 8.7–10.2)
Chloride: 100 mmol/L (ref 96–106)
Creatinine, Ser: 1.01 mg/dL — ABNORMAL HIGH (ref 0.57–1.00)
Globulin, Total: 3.1 g/dL (ref 1.5–4.5)
Glucose: 87 mg/dL (ref 70–99)
Potassium: 3.8 mmol/L (ref 3.5–5.2)
Sodium: 138 mmol/L (ref 134–144)
Total Protein: 7.2 g/dL (ref 6.0–8.5)
eGFR: 69 mL/min/{1.73_m2} (ref >59)

## 2023-10-14 LAB — CBC WITH DIFFERENTIAL/PLATELET
Basophils Absolute: 0.1 10*3/uL (ref 0.0–0.2)
Basos: 1 %
EOS (ABSOLUTE): 0.1 10*3/uL (ref 0.0–0.4)
Eos: 1 %
Hematocrit: 41.2 % (ref 34.0–46.6)
Hemoglobin: 13.1 g/dL (ref 11.1–15.9)
Immature Grans (Abs): 0 10*3/uL (ref 0.0–0.1)
Immature Granulocytes: 0 %
Lymphocytes Absolute: 2 10*3/uL (ref 0.7–3.1)
Lymphs: 26 %
MCH: 28.5 pg (ref 26.6–33.0)
MCHC: 31.8 g/dL (ref 31.5–35.7)
MCV: 90 fL (ref 79–97)
Monocytes Absolute: 0.7 10*3/uL (ref 0.1–0.9)
Monocytes: 9 %
Neutrophils Absolute: 4.9 10*3/uL (ref 1.4–7.0)
Neutrophils: 63 %
Platelets: 409 10*3/uL (ref 150–450)
RBC: 4.6 x10E6/uL (ref 3.77–5.28)
RDW: 13.6 % (ref 11.7–15.4)
WBC: 7.8 10*3/uL (ref 3.4–10.8)

## 2023-10-14 LAB — TSH: TSH: 1.27 u[IU]/mL (ref 0.450–4.500)

## 2023-10-14 LAB — LIPID PANEL
Chol/HDL Ratio: 3.9 ratio (ref 0.0–4.4)
Cholesterol, Total: 179 mg/dL (ref 100–199)
HDL: 46 mg/dL (ref >39)
LDL Chol Calc (NIH): 117 mg/dL — ABNORMAL HIGH (ref 0–99)
Triglycerides: 88 mg/dL (ref 0–149)
VLDL Cholesterol Cal: 16 mg/dL (ref 5–40)

## 2023-10-14 LAB — VITAMIN D 25 HYDROXY (VIT D DEFICIENCY, FRACTURES): Vit D, 25-Hydroxy: 33.1 ng/mL (ref 30.0–100.0)

## 2023-11-02 ENCOUNTER — Ambulatory Visit: Payer: 59 | Admitting: Family Medicine

## 2023-12-06 ENCOUNTER — Encounter: Payer: Self-pay | Admitting: Family Medicine

## 2023-12-06 ENCOUNTER — Ambulatory Visit (INDEPENDENT_AMBULATORY_CARE_PROVIDER_SITE_OTHER): Payer: 59 | Admitting: Family Medicine

## 2023-12-06 VITALS — BP 126/86 | HR 90 | Temp 98.3°F | Ht 64.0 in | Wt 230.6 lb

## 2023-12-06 DIAGNOSIS — I10 Essential (primary) hypertension: Secondary | ICD-10-CM | POA: Diagnosis not present

## 2023-12-06 DIAGNOSIS — L732 Hidradenitis suppurativa: Secondary | ICD-10-CM

## 2023-12-06 MED ORDER — CLINDAMYCIN PHOSPHATE 1 % EX SOLN
Freq: Two times a day (BID) | CUTANEOUS | 5 refills | Status: AC
Start: 2023-12-06 — End: ?

## 2023-12-06 MED ORDER — HYDROCHLOROTHIAZIDE 25 MG PO TABS: 25.0000 mg | ORAL_TABLET | Freq: Every day | ORAL | 3 refills | Status: DC

## 2023-12-06 NOTE — Progress Notes (Signed)
 Subjective: CC:HTN. weight PCP: Jolinda Norene HERO, DO YEP:April Roman is a 48 y.o. female presenting to clinic today for:  1.  Hypertension associated with obesity Since her last visit patient notes blood pressures have been under better control with average blood pressures running in the 110s to 120s over 80s.  She reports improvement in her headaches.  No chest pain, shortness of breath or dizziness.  She has been taking the compounded semaglutide  and is up to 1 mg weekly.  She has had about a 13 pound weight loss since initiation.  She does report that initially she did have some GI upset but realized that she had been dosing incorrectly.  Now she has been doing well and has not required any of the nausea pills.  She has lately been getting a little bit of constipation but denies any overt straining or blood in stool.  She is hydrating well but does not eat vegetables daily.  She has not tried utilizing any fiber or stool softeners yet.  2.  Hidradenitis suppurativa She reports as needed use of Cleocin .  Needs renewal.  No active lesions currently  ROS: Per HPI  No Known Allergies Past Medical History:  Diagnosis Date   Allergy    Arm vein blood clot, left    COVID-19    Environmental allergies    Factor 5 Leiden mutation, heterozygous (HCC)    Vision abnormalities     Current Outpatient Medications:    clindamycin  (CLEOCIN -T) 1 % external solution, Apply topically 2 (two) times daily., Disp: 30 mL, Rfl: 5   Fexofenadine HCl (ALLERGY 24-HR PO), Take by mouth., Disp: , Rfl:    fluticasone  (FLONASE ) 50 MCG/ACT nasal spray, Place 2 sprays into both nostrils daily., Disp: 16 g, Rfl: 6   hydrochlorothiazide  (HYDRODIURIL ) 25 MG tablet, Take 1 tablet (25 mg total) by mouth daily., Disp: 90 tablet, Rfl: 3   hydroquinone  4 % cream, Apply topically 2 (two) times daily., Disp: 28.35 g, Rfl: 0   Rimegepant Sulfate (NURTEC) 75 MG TBDP, Dissolve 1 tablet in mouth ONCE if needed for  migraine headache, Disp: 4 tablet, Rfl: 0   Semaglutide , 2 MG/DOSE, (OZEMPIC , 2 MG/DOSE,) 8 MG/3ML SOPN, Titrate monthly as directed. [facility] drug compound, Disp: , Rfl:  Social History   Socioeconomic History   Marital status: Married    Spouse name: david    Number of children: 2   Years of education: Not on file   Highest education level: Master's degree (e.g., MA, MS, MEng, MEd, MSW, MBA)  Occupational History    Employer: [employer]  Tobacco Use   Smoking status: Never   Smokeless tobacco: Never  Vaping Use   Vaping status: Never Used  Substance and Sexual Activity   Alcohol use: Yes    Comment: occ   Drug use: No   Sexual activity: Yes    Birth control/protection: None  Other Topics Concern   Not on file  Social History Narrative   Drinks 20-40 oz caffeine daily.   Social Drivers of Corporate Investment Banker Strain: Low Risk  (12/05/2023)   Overall Financial Resource Strain (CARDIA)    Difficulty of Paying Living Expenses: Not hard at all  Food Insecurity: No Food Insecurity (12/05/2023)   Hunger Vital Sign    Worried About Running Out of Food in the Last Year: Never true    Ran Out of Food in the Last Year: Never true  Transportation Needs: No Transportation Needs (12/05/2023)  PRAPARE - Administrator, Civil Service (Medical): No    Lack of Transportation (Non-Medical): No  Physical Activity: Unknown (12/05/2023)   Exercise Vital Sign    Days of Exercise per Week: 0 days    Minutes of Exercise per Session: Not on file  Stress: No Stress Concern Present (12/05/2023)   [employer] of Occupational Health - Occupational Stress Questionnaire    Feeling of Stress : Not at all  Social Connections: Moderately Isolated (12/05/2023)   Social Connection and Isolation Panel [NHANES]    Frequency of Communication with Friends and Family: More than three times a week    Frequency of Social Gatherings with Friends and Family: Twice a week    Attends Religious  Services: Never    Database Administrator or Organizations: No    Attends Engineer, Structural: Not on file    Marital Status: Married  Catering Manager Violence: Not At Risk (01/29/2020)   Received from [facility], [facility]   Humiliation, Afraid, Rape, and Kick questionnaire    Fear of Current or Ex-Partner: No    Emotionally Abused: No    Physically Abused: No    Sexually Abused: No   Family History  Problem Relation Age of Onset   Arthritis Mother    Hypertension Mother    Diabetes Father    Heart disease Father    Hypertension Father    Cancer Father        esophogeal    Psoriasis Maternal Aunt    Cirrhosis Maternal Aunt    Dementia Maternal Grandmother    Heart disease Maternal Grandfather    Alzheimer's disease Paternal Grandmother    Heart attack Paternal Grandfather        18s    Cancer Maternal Aunt        breast     Objective: Office vital signs reviewed. BP 126/86   Pulse 90   Temp 98.3 F (36.8 C)   Ht 5' 4 (1.626 m)   Wt 230 lb 9.6 oz (104.6 kg)   SpO2 100%   BMI 39.58 kg/m   Physical Examination:  General: Awake, alert, well nourished, No acute distress HEENT: sclera white, MMM Cardio: regular rate and rhythm, S1S2 heard, no murmurs appreciated Pulm: clear to auscultation bilaterally, no wheezes, rhonchi or rales; normal work of breathing on room air  Assessment/ Plan: 48 y.o. female   Essential hypertension - Plan: hydrochlorothiazide  (HYDRODIURIL ) 25 MG tablet  Morbid obesity (HCC)  Axillary hidradenitis suppurativa - Plan: clindamycin  (CLEOCIN -T) 1 % external solution  Blood pressure is controlled upon recheck.  No changes made.  Have adjusted her HCTZ so that she will hold it should her blood pressure drop below 110 over 60s.  We discussed that she should monitor her blood pressure intermittently and if she starts feeling dizzy or unusual she should metasure that is well.  I anticipate that as she loses more weight she  will have less need for blood pressure medication.  We will plan to reassess her again in 6 months, sooner if concerns arise.  Encouraged her to utilize Colace or fiber supplement to assist with constipation.  She will contact me if needing alternative therapies and we can give her sample of Linzess or similar.  She is having excellent success with the compounded semaglutide  and I have sent over the 1.7 and 2.4 mg Rx to [place] to place on hold.  Her hidradenitis suppurativa is stable and  she is utilizing Cleocin  topically as needed   Norene CHRISTELLA Fielding, DO [facility] [phone]

## 2024-02-09 ENCOUNTER — Encounter: Payer: Self-pay | Admitting: Family Medicine

## 2024-02-12 ENCOUNTER — Encounter: Payer: Self-pay | Admitting: Family Medicine

## 2024-03-11 ENCOUNTER — Other Ambulatory Visit: Payer: Self-pay

## 2024-03-11 MED ORDER — SEMAGLUTIDE-WEIGHT MANAGEMENT 1.7 MG/0.75ML ~~LOC~~ SOAJ: 1.7000 mg | SUBCUTANEOUS | 0 refills | Status: DC

## 2024-03-11 MED ORDER — WEGOVY 2.4 MG/0.75ML ~~LOC~~ SOAJ: 2.4000 mg | SUBCUTANEOUS | 0 refills | Status: DC

## 2024-03-12 ENCOUNTER — Other Ambulatory Visit: Payer: Self-pay

## 2024-03-12 MED ORDER — SEMAGLUTIDE-WEIGHT MANAGEMENT 1.7 MG/0.75ML ~~LOC~~ SOAJ: 1.7000 mg | SUBCUTANEOUS | 0 refills | Status: DC

## 2024-03-12 MED ORDER — SEMAGLUTIDE-WEIGHT MANAGEMENT 2.4 MG/0.75ML ~~LOC~~ SOAJ: 2.4000 mg | SUBCUTANEOUS | 0 refills | Status: DC

## 2024-03-13 ENCOUNTER — Other Ambulatory Visit (HOSPITAL_COMMUNITY): Payer: Self-pay

## 2024-03-13 ENCOUNTER — Telehealth: Payer: Self-pay | Admitting: Pharmacy Technician

## 2024-03-13 NOTE — Telephone Encounter (Signed)
 From my 2023 note when this was initially discussed to initiate GLP:  "Concerns today include: 1. Hypertension associated with morbid obesity, migraine headaches Patient was seen 1 month ago.  At that time her blood pressure was grossly uncontrolled at 160/101.  She was advised to continue with the weight loss program she had just recently enrolled then.  Hydrochlorothiazide 25 mg daily ordered.  She is here for recheck and physical.  Migraine headaches have been absent since her last visit.  She has not had to use the Nurtec, which was sampled to her last visit.  Her blood pressures have been running 130s over 80s to 90s.  No chest pain, shortness of breath or edema reported.  She is very interested in losing weight through medication at this point.  She has changed her dietary habits quite a bit through the bariatric clinic and has subsequently lost 7 pounds but the cost of going is quite cumbersome at $100 per visit not including the B12 injection which she pays for separately.  She has no contraindications to GLP class and would be interested in this if available to her."

## 2024-03-13 NOTE — Telephone Encounter (Signed)
 Insurance companies are becoming increasingly stricter about requiring thorough documentation of lifestyle modifications in the patient's chart at each visit. This includes detailed records of diet recommendations (caloric intake, etc), exercise plans (amount of time/wk, etc), and an emphasis on the patient's commitment to continuing these efforts while on medication.  Without this additional documentation in the chart notes, a prior authorization will most likely be denied. We need a documented current weight taken within the last 30 days per insurance to submit with the PA.  Thanks!

## 2024-03-14 ENCOUNTER — Other Ambulatory Visit (HOSPITAL_COMMUNITY): Payer: Self-pay

## 2024-03-15 ENCOUNTER — Other Ambulatory Visit (HOSPITAL_COMMUNITY): Payer: Self-pay

## 2024-03-15 NOTE — Telephone Encounter (Signed)
 Pharmacy Patient Advocate Encounter  Received notification from [facility] that Prior Authorization for WEGOVY  1.7MG /0.75ML has been APPROVED from 03/14/2024 to 03/14/2025. Ran test claim, Copay is $318.46. This test claim was processed through [facility]- copay amounts may vary at other pharmacies due to pharmacy/plan contracts, or as the patient moves through the different stages of their insurance plan.   PA #/Case ID/Reference #: [id number]

## 2024-03-18 ENCOUNTER — Encounter: Payer: Self-pay | Admitting: *Deleted

## 2024-03-19 ENCOUNTER — Other Ambulatory Visit: Payer: Self-pay

## 2024-03-19 DIAGNOSIS — I10 Essential (primary) hypertension: Secondary | ICD-10-CM

## 2024-03-19 MED ORDER — WEGOVY 2.4 MG/0.75ML ~~LOC~~ SOAJ: 2.4000 mg | SUBCUTANEOUS | 3 refills | Status: DC

## 2024-03-19 MED ORDER — WEGOVY 1.7 MG/0.75ML ~~LOC~~ SOAJ: 1.7000 mg | SUBCUTANEOUS | 0 refills | Status: DC

## 2024-03-19 MED ORDER — SEMAGLUTIDE-WEIGHT MANAGEMENT 2.4 MG/0.75ML ~~LOC~~ SOAJ: 2.4000 mg | SUBCUTANEOUS | 3 refills | Status: DC

## 2024-03-19 MED ORDER — SEMAGLUTIDE-WEIGHT MANAGEMENT 1.7 MG/0.75ML ~~LOC~~ SOAJ: 1.7000 mg | SUBCUTANEOUS | 0 refills | Status: DC

## 2024-03-19 NOTE — Progress Notes (Signed)
 Keeps saying transmission failed. It may have to be manually faxed.

## 2024-03-19 NOTE — Addendum Note (Signed)
 Addended by: Eliodoro Guerin on: 03/19/2024 03:06 PM   Modules accepted: Orders

## 2024-03-19 NOTE — Addendum Note (Signed)
 Addended by: Zipporah Finamore D on: 03/19/2024 04:05 PM   Modules accepted: Orders

## 2024-03-19 NOTE — Progress Notes (Signed)
 Refill failed. resent

## 2024-03-20 ENCOUNTER — Other Ambulatory Visit: Payer: Self-pay | Admitting: Family Medicine

## 2024-03-20 ENCOUNTER — Other Ambulatory Visit (HOSPITAL_COMMUNITY): Payer: Self-pay

## 2024-03-20 DIAGNOSIS — I10 Essential (primary) hypertension: Secondary | ICD-10-CM

## 2024-03-20 MED ORDER — WEGOVY 1.7 MG/0.75ML ~~LOC~~ SOAJ: 1.7000 mg | SUBCUTANEOUS | 0 refills | Status: DC

## 2024-03-20 MED ORDER — WEGOVY 2.4 MG/0.75ML ~~LOC~~ SOAJ
2.4000 mg | SUBCUTANEOUS | 0 refills | Status: DC
  Filled 2024-08-01: qty 3, 28d supply, fill #0

## 2024-03-20 NOTE — Telephone Encounter (Signed)
 Please see EMR.  This was sent several times. There is an internal communication issue.  I think Caryl Clas was going to try and either fax or call but please double check with her

## 2024-03-21 ENCOUNTER — Other Ambulatory Visit (HOSPITAL_COMMUNITY): Payer: Self-pay

## 2024-03-21 ENCOUNTER — Telehealth: Payer: Self-pay

## 2024-03-21 NOTE — Telephone Encounter (Signed)
 PLEASE BE ADVISED RECEIVED MESSAGE  IN OTHER ENCOUNTER FROM NURSE IN REGARDS TO BE DENIED AND IT IS APPROVE PER TEST CLAIM FOR COST OF $318.00     called pt to in regauds to her PA FOR WEGOVY . I spoke to pt and let her know [facility] because I spoke to pharmacy and they said it went through on there end for 318.00 and she stated she understood and will be going to pharmacy.

## 2024-05-22 ENCOUNTER — Other Ambulatory Visit: Payer: Self-pay | Admitting: Family Medicine

## 2024-05-22 DIAGNOSIS — I10 Essential (primary) hypertension: Secondary | ICD-10-CM

## 2024-05-27 ENCOUNTER — Encounter: Payer: Self-pay | Admitting: Family Medicine

## 2024-05-27 DIAGNOSIS — I10 Essential (primary) hypertension: Secondary | ICD-10-CM

## 2024-05-27 MED ORDER — WEGOVY 1.7 MG/0.75ML ~~LOC~~ SOAJ: 1.7000 mg | SUBCUTANEOUS | 0 refills | Status: DC

## 2024-06-03 ENCOUNTER — Other Ambulatory Visit: Payer: Self-pay

## 2024-06-03 ENCOUNTER — Other Ambulatory Visit: Payer: Self-pay | Admitting: Family Medicine

## 2024-06-03 DIAGNOSIS — I10 Essential (primary) hypertension: Secondary | ICD-10-CM

## 2024-06-03 MED ORDER — WEGOVY 1.7 MG/0.75ML ~~LOC~~ SOAJ: 1.7000 mg | SUBCUTANEOUS | 99 refills | Status: DC

## 2024-06-03 NOTE — Addendum Note (Signed)
 Addended by: JOLINDA NORENE HERO on: 06/03/2024 04:40 PM   Modules accepted: Orders

## 2024-06-04 ENCOUNTER — Ambulatory Visit: Payer: 59 | Admitting: Family Medicine

## 2024-06-04 ENCOUNTER — Encounter: Payer: Self-pay | Admitting: Family Medicine

## 2024-06-04 VITALS — BP 128/81 | HR 75 | Temp 98.1°F | Ht 64.0 in | Wt 202.0 lb

## 2024-06-04 DIAGNOSIS — E7800 Pure hypercholesterolemia, unspecified: Secondary | ICD-10-CM | POA: Diagnosis not present

## 2024-06-04 DIAGNOSIS — I10 Essential (primary) hypertension: Secondary | ICD-10-CM | POA: Diagnosis not present

## 2024-06-04 DIAGNOSIS — Z7689 Persons encountering health services in other specified circumstances: Secondary | ICD-10-CM

## 2024-06-04 DIAGNOSIS — Z6834 Body mass index (BMI) 34.0-34.9, adult: Secondary | ICD-10-CM

## 2024-06-04 DIAGNOSIS — K5909 Other constipation: Secondary | ICD-10-CM

## 2024-06-04 MED ORDER — LUBIPROSTONE 24 MCG PO CAPS: 24.0000 ug | ORAL_CAPSULE | Freq: Two times a day (BID) | ORAL | 3 refills | Status: DC

## 2024-06-04 NOTE — Progress Notes (Signed)
 Subjective: RR:tzphyu check PCP: Jolinda Norene HERO, DO YEP:Dyzmmb April Roman is a 48 y.o. female presenting to clinic today for:  1. Obesity Doing well on Wegovy  1.7mg  weekly.  Wants to stay on that dose for a while.  She notes that she is been doing really well and has not really had much GI issues except for constipation.  She has been adding fiber chews with probiotic as well as hydrating really well but still has a lot of pellet-like stools.  She is incorporating some exercise but nothing regularly right now.  She reports no abdominal pain, nausea or vomiting.  2.  Hypertension with hyperlipidemia Patient is compliant with HCTZ.  No chest pain, shortness of breath, edema or dizzy spells.   ROS: Per HPI  No Known Allergies Past Medical History:  Diagnosis Date   Allergy    Arm vein blood clot, left    COVID-19    Environmental allergies    Factor 5 Leiden mutation, heterozygous (HCC)    Vision abnormalities     Current Outpatient Medications:    clindamycin  (CLEOCIN -T) 1 % external solution, Apply topically 2 (two) times daily., Disp: 30 mL, Rfl: 5   Fexofenadine HCl (ALLERGY 24-HR PO), Take by mouth., Disp: , Rfl:    fluticasone  (FLONASE ) 50 MCG/ACT nasal spray, Place 2 sprays into both nostrils daily., Disp: 16 g, Rfl: 6   hydrochlorothiazide  (HYDRODIURIL ) 25 MG tablet, Take 1 tablet (25 mg total) by mouth daily. Hold if BP <110/60, Disp: 90 tablet, Rfl: 3   hydroquinone  4 % cream, Apply topically 2 (two) times daily., Disp: 28.35 g, Rfl: 0   lubiprostone  (AMITIZA ) 24 MCG capsule, Take 1 capsule (24 mcg total) by mouth 2 (two) times daily with a meal. For constipation, Disp: 180 capsule, Rfl: 3   Rimegepant Sulfate (NURTEC) 75 MG TBDP, Dissolve 1 tablet in mouth ONCE if needed for migraine headache, Disp: 4 tablet, Rfl: 0   Semaglutide -Weight Management (WEGOVY ) 1.7 MG/0.75ML SOAJ, Inject 1.7 mg into the skin every 7 (seven) days. Month#4, Disp: 3 mL, Rfl: PRN    Semaglutide -Weight Management (WEGOVY ) 2.4 MG/0.75ML SOAJ, Inject 2.4 mg into the skin every 7 (seven) days., Disp: 9 mL, Rfl: 3 Social History   Socioeconomic History   Marital status: Married    Spouse name: david    Number of children: 2   Years of education: Not on file   Highest education level: Master's degree (e.g., MA, MS, MEng, MEd, MSW, MBA)  Occupational History    Employer: [employer]  Tobacco Use   Smoking status: Never   Smokeless tobacco: Never  Vaping Use   Vaping status: Never Used  Substance and Sexual Activity   Alcohol use: Yes    Comment: occ   Drug use: No   Sexual activity: Yes    Birth control/protection: None  Other Topics Concern   Not on file  Social History Narrative   Drinks 20-40 oz caffeine daily.   Social Drivers of Corporate investment banker Strain: Low Risk  (12/05/2023)   Overall Financial Resource Strain (CARDIA)    Difficulty of Paying Living Expenses: Not hard at all  Food Insecurity: No Food Insecurity (12/05/2023)   Hunger Vital Sign    Worried About Running Out of Food in the Last Year: Never true    Ran Out of Food in the Last Year: Never true  Transportation Needs: No Transportation Needs (12/05/2023)   PRAPARE - Administrator, Civil Service (  Medical): No    Lack of Transportation (Non-Medical): No  Physical Activity: Unknown (12/05/2023)   Exercise Vital Sign    Days of Exercise per Week: 0 days    Minutes of Exercise per Session: Not on file  Stress: No Stress Concern Present (12/05/2023)   [employer] of Occupational Health - Occupational Stress Questionnaire    Feeling of Stress : Not at all  Social Connections: Moderately Isolated (12/05/2023)   Social Connection and Isolation Panel    Frequency of Communication with Friends and Family: More than three times a week    Frequency of Social Gatherings with Friends and Family: Twice a week    Attends Religious Services: Never    Database administrator or  Organizations: No    Attends Engineer, structural: Not on file    Marital Status: Married  Catering manager Violence: Not At Risk (01/29/2020)   Received from [facility]   Humiliation, Afraid, Rape, and Kick questionnaire    Within the last year, have you been afraid of your partner or ex-partner?: No    Within the last year, have you been humiliated or emotionally abused in other ways by your partner or ex-partner?: No    Within the last year, have you been kicked, hit, slapped, or otherwise physically hurt by your partner or ex-partner?: No    Within the last year, have you been raped or forced to have any kind of sexual activity by your partner or ex-partner?: No   Family History  Problem Relation Age of Onset   Arthritis Mother    Hypertension Mother    Diabetes Father    Heart disease Father    Hypertension Father    Cancer Father        esophogeal    Psoriasis Maternal Aunt    Cirrhosis Maternal Aunt    Dementia Maternal Grandmother    Heart disease Maternal Grandfather    Alzheimer's disease Paternal Grandmother    Heart attack Paternal Grandfather        69s    Cancer Maternal Aunt        breast     Objective: Office vital signs reviewed. BP 128/81   Pulse 75   Temp 98.1 F (36.7 C)   Ht 5' 4 (1.626 m)   Wt 202 lb (91.6 kg)   SpO2 98%   BMI 34.67 kg/m   Physical Examination:  General: Awake, alert, well nourished, No acute distress HEENT: sclera white, MMM Cardio: regular rate and rhythm, S1S2 heard, no murmurs appreciated Pulm: clear to auscultation bilaterally, no wheezes, rhonchi or rales; normal work of breathing on room air GI: soft, non-tender, non-distended, bowel sounds present x4, no hepatomegaly, no splenomegaly, no masses   Assessment/ Plan: 48 y.o. female   Encounter for weight management  BMI 34.0-34.9,adult  Pure hypercholesterolemia - Plan: Lipid Panel  Essential hypertension  Chronic constipation - Plan: lubiprostone   (AMITIZA ) 24 MCG capsule  Repeat fasting lipid panel. BMI no longer in morbid obesity range, she has successfully lost 41 pounds since initiation of medication.  She will maintain a 1.7 mg weekly until she has plateaued and then she can advance to 2.4 mg weekly which is on file at her pharmacy.  Her blood pressure is well-controlled and we discussed signs and symptoms that would warrant discontinuation of the HCTZ.  I have given her samples of Linzess and I will prescribe Amitiza  as Linzess is not covered by insurance.  Continue adequate  fiber and water intake.  May follow-up in 6 months for annual physical, sooner if concerns arise   Norene CHRISTELLA Fielding, DO [facility] [phone]

## 2024-06-05 ENCOUNTER — Ambulatory Visit: Payer: Self-pay | Admitting: Family Medicine

## 2024-06-05 LAB — LIPID PANEL
Chol/HDL Ratio: 3.8 ratio (ref 0.0–4.4)
Cholesterol, Total: 180 mg/dL (ref 100–199)
HDL: 47 mg/dL (ref >39)
LDL Chol Calc (NIH): 117 mg/dL — ABNORMAL HIGH (ref 0–99)
Triglycerides: 88 mg/dL (ref 0–149)
VLDL Cholesterol Cal: 16 mg/dL (ref 5–40)

## 2024-07-01 ENCOUNTER — Other Ambulatory Visit: Payer: Self-pay | Admitting: Family Medicine

## 2024-07-01 DIAGNOSIS — Z1231 Encounter for screening mammogram for malignant neoplasm of breast: Secondary | ICD-10-CM

## 2024-07-16 ENCOUNTER — Other Ambulatory Visit: Payer: Self-pay | Admitting: Family Medicine

## 2024-07-16 DIAGNOSIS — Z1231 Encounter for screening mammogram for malignant neoplasm of breast: Secondary | ICD-10-CM

## 2024-07-18 ENCOUNTER — Encounter

## 2024-07-18 DIAGNOSIS — Z1231 Encounter for screening mammogram for malignant neoplasm of breast: Secondary | ICD-10-CM

## 2024-07-31 ENCOUNTER — Encounter: Payer: Self-pay | Admitting: Family Medicine

## 2024-08-01 ENCOUNTER — Other Ambulatory Visit (HOSPITAL_COMMUNITY): Payer: Self-pay

## 2024-08-01 ENCOUNTER — Other Ambulatory Visit (HOSPITAL_BASED_OUTPATIENT_CLINIC_OR_DEPARTMENT_OTHER): Payer: Self-pay

## 2024-08-01 ENCOUNTER — Ambulatory Visit
Admission: RE | Admit: 2024-08-01 | Discharge: 2024-08-01 | Disposition: A | Discharge status: Home / Self Care | Source: Ambulatory Visit

## 2024-08-01 DIAGNOSIS — Z1231 Encounter for screening mammogram for malignant neoplasm of breast: Secondary | ICD-10-CM

## 2024-08-27 ENCOUNTER — Other Ambulatory Visit (HOSPITAL_BASED_OUTPATIENT_CLINIC_OR_DEPARTMENT_OTHER): Payer: Self-pay

## 2024-08-27 ENCOUNTER — Other Ambulatory Visit: Payer: Self-pay | Admitting: Family Medicine

## 2024-08-27 DIAGNOSIS — I10 Essential (primary) hypertension: Secondary | ICD-10-CM

## 2024-08-27 MED ORDER — WEGOVY 2.4 MG/0.75ML ~~LOC~~ SOAJ
2.4000 mg | SUBCUTANEOUS | 1 refills | Status: DC
  Filled 2024-08-27: qty 3, 28d supply, fill #0
  Filled 2024-09-21: qty 3, 28d supply, fill #1
  Filled 2024-10-16: qty 3, 28d supply, fill #2
  Filled 2024-11-15: qty 3, 28d supply, fill #3

## 2024-09-21 ENCOUNTER — Other Ambulatory Visit (HOSPITAL_BASED_OUTPATIENT_CLINIC_OR_DEPARTMENT_OTHER): Payer: Self-pay

## 2024-12-13 ENCOUNTER — Ambulatory Visit (INDEPENDENT_AMBULATORY_CARE_PROVIDER_SITE_OTHER): Payer: Self-pay | Admitting: Family Medicine

## 2024-12-13 ENCOUNTER — Encounter: Payer: Self-pay | Admitting: Family Medicine

## 2024-12-13 ENCOUNTER — Other Ambulatory Visit: Payer: Self-pay

## 2024-12-13 ENCOUNTER — Other Ambulatory Visit (HOSPITAL_BASED_OUTPATIENT_CLINIC_OR_DEPARTMENT_OTHER): Payer: Self-pay

## 2024-12-13 VITALS — BP 126/87 | HR 83 | Temp 98.0°F | Ht 64.0 in | Wt 192.2 lb

## 2024-12-13 DIAGNOSIS — L720 Epidermal cyst: Secondary | ICD-10-CM | POA: Diagnosis not present

## 2024-12-13 DIAGNOSIS — Z6833 Body mass index (BMI) 33.0-33.9, adult: Secondary | ICD-10-CM

## 2024-12-13 DIAGNOSIS — K5909 Other constipation: Secondary | ICD-10-CM

## 2024-12-13 DIAGNOSIS — I10 Essential (primary) hypertension: Secondary | ICD-10-CM | POA: Diagnosis not present

## 2024-12-13 DIAGNOSIS — Z0000 Encounter for general adult medical examination without abnormal findings: Secondary | ICD-10-CM

## 2024-12-13 DIAGNOSIS — E7800 Pure hypercholesterolemia, unspecified: Secondary | ICD-10-CM | POA: Diagnosis not present

## 2024-12-13 DIAGNOSIS — L209 Atopic dermatitis, unspecified: Secondary | ICD-10-CM

## 2024-12-13 DIAGNOSIS — D6851 Activated protein C resistance: Secondary | ICD-10-CM

## 2024-12-13 LAB — BAYER DCA HB A1C WAIVED: HB A1C (BAYER DCA - WAIVED): 5.1 % (ref 4.8–5.6)

## 2024-12-13 MED ORDER — HYDROCHLOROTHIAZIDE 25 MG PO TABS: 25.0000 mg | ORAL_TABLET | Freq: Every day | ORAL | 3 refills | Status: DC

## 2024-12-13 MED ORDER — TRIAMCINOLONE ACETONIDE 0.1 % EX CREA
1.0000 | TOPICAL_CREAM | Freq: Two times a day (BID) | CUTANEOUS | 1 refills | Status: AC | PRN
  Filled 2024-12-13 – 2024-12-25 (×2): qty 30, 30d supply, fill #0

## 2024-12-13 MED ORDER — LUBIPROSTONE 24 MCG PO CAPS: 24.0000 ug | ORAL_CAPSULE | Freq: Two times a day (BID) | ORAL | 3 refills | Status: DC

## 2024-12-13 MED ORDER — WEGOVY 2.4 MG/0.75ML ~~LOC~~ SOAJ: 2.4000 mg | SUBCUTANEOUS | 4 refills | Status: DC

## 2024-12-13 MED ORDER — LUBIPROSTONE 24 MCG PO CAPS
24.0000 ug | ORAL_CAPSULE | Freq: Two times a day (BID) | ORAL | 3 refills | Status: AC
  Filled 2024-12-13: qty 60, 30d supply, fill #0

## 2024-12-13 MED ORDER — HYDROCHLOROTHIAZIDE 25 MG PO TABS
25.0000 mg | ORAL_TABLET | Freq: Every day | ORAL | 3 refills | Status: AC
  Filled 2024-12-13: qty 30, 30d supply, fill #0

## 2024-12-13 MED ORDER — WEGOVY 2.4 MG/0.75ML ~~LOC~~ SOAJ
2.4000 mg | SUBCUTANEOUS | 4 refills | Status: AC
  Filled 2024-12-13 – 2024-12-25 (×2): qty 3, 28d supply, fill #0

## 2024-12-13 MED ORDER — CLOBETASOL PROPIONATE 0.05 % EX SOLN
1.0000 | Freq: Two times a day (BID) | CUTANEOUS | 0 refills | Status: AC | PRN
  Filled 2024-12-13: qty 50, 25d supply, fill #0
  Filled 2024-12-25: qty 25, 30d supply, fill #0

## 2024-12-13 NOTE — Progress Notes (Signed)
 "  April Roman is a 49 y.o. female presents to office today for annual physical exam examination.    Patient reports that she has been doing fairly well.  She is compliant with Wegovy  2.4 mg weekly and denies any significant GI side effects.  She occasionally does have constipation and will utilize her Amitiza  for that.  No rectal bleeding.  No nausea, vomiting or abdominal pain.  Compliant with HCTZ and blood pressures have been well-controlled.  Does report that she has a little bit of flareup of eczema versus psoriasis at the base of her neck and would like to get triamcinolone  cream renewed for as needed use.  The rash on the chest resolved.  She does have a spot on her back that was sore and very enlarged several days ago but it seems to be going down.  She just wants me to take a look at it.  Denies any drainage.  No fevers.  Work is going well.  Her daughter is in college and seems to be doing well.  Her son is now living in [place]  with his wife.  No plans for any grandchildren anytime soon but overall things seem to be doing okay.  Marital status: married, Substance use: none Health Maintenance Due  Topic Date Due   DTaP/Tdap/Td (1 - Tdap) Never done   Hepatitis B Vaccines 19-59 Average Risk (1 of 3 - 19+ 3-dose series) Never done   Influenza Vaccine  06/28/2024   COVID-19 Vaccine (3 - 2025-26 season) 07/29/2024    Immunization History  Administered Date(s) Administered   Influenza,inj,quad, With Preservative 09/20/2019   PFIZER(Purple Top)SARS-COV-2 Vaccination 02/20/2020, 03/14/2020   Past Medical History:  Diagnosis Date   Allergy    Arm vein blood clot, left    COVID-19    Environmental allergies    Factor 5 Leiden mutation, heterozygous    Vision abnormalities    Social History   Socioeconomic History   Marital status: Married    Spouse name: david    Number of children: 2   Years of education: Not on file   Highest education level: Master's degree (e.g., MA,  MS, MEng, MEd, MSW, MBA)  Occupational History    Employer: [employer]  Tobacco Use   Smoking status: Never   Smokeless tobacco: Never  Vaping Use   Vaping status: Never Used  Substance and Sexual Activity   Alcohol use: Yes    Comment: occ   Drug use: No   Sexual activity: Yes    Birth control/protection: None  Other Topics Concern   Not on file  Social History Narrative   Drinks 20-40 oz caffeine daily.   Social Drivers of Health   Tobacco Use: Low Risk (06/04/2024)   Patient History    Smoking Tobacco Use: Never    Smokeless Tobacco Use: Never    Passive Exposure: Not on file  Financial Resource Strain: Low Risk (12/05/2023)   Overall Financial Resource Strain (CARDIA)    Difficulty of Paying Living Expenses: Not hard at all  Food Insecurity: No Food Insecurity (12/05/2023)   Hunger Vital Sign    Worried About Running Out of Food in the Last Year: Never true    Ran Out of Food in the Last Year: Never true  Transportation Needs: No Transportation Needs (12/05/2023)   PRAPARE - Administrator, Civil Service (Medical): No    Lack of Transportation (Non-Medical): No  Physical Activity: Unknown (12/05/2023)   Exercise Vital Sign  Days of Exercise per Week: 0 days    Minutes of Exercise per Session: Not on file  Stress: No Stress Concern Present (12/05/2023)   [employer] of Occupational Health - Occupational Stress Questionnaire    Feeling of Stress : Not at all  Social Connections: Moderately Isolated (12/05/2023)   Social Connection and Isolation Panel    Frequency of Communication with Friends and Family: More than three times a week    Frequency of Social Gatherings with Friends and Family: Twice a week    Attends Religious Services: Never    Database Administrator or Organizations: No    Attends Engineer, Structural: Not on file    Marital Status: Married  Catering Manager Violence: Not on file  Depression (PHQ2-9): Low Risk (06/04/2024)    Depression (PHQ2-9)    PHQ-2 Score: 0  Alcohol Screen: Low Risk (12/05/2023)   Alcohol Screen    Last Alcohol Screening Score (AUDIT): 1  Housing: Low Risk (12/05/2023)   Housing Stability Vital Sign    Unable to Pay for Housing in the Last Year: No    Number of Times Moved in the Last Year: 0    Homeless in the Last Year: No  Utilities: Not on file  Health Literacy: Not on file   No past surgical history on file. Family History  Problem Relation Age of Onset   Arthritis Mother    Hypertension Mother    Diabetes Father    Heart disease Father    Hypertension Father    Cancer Father        esophogeal    Psoriasis Maternal Aunt    Cirrhosis Maternal Aunt    Breast cancer Maternal Aunt    Cancer Maternal Aunt        breast    Dementia Maternal Grandmother    Heart disease Maternal Grandfather    Alzheimer's disease Paternal Grandmother    Heart attack Paternal Grandfather        66s    Current Medications[1]  Allergies[2]   ROS: Review of Systems Pertinent items noted in HPI and remainder of comprehensive ROS otherwise negative.    Physical exam BP 126/87   Pulse 83   Temp 98 F (36.7 C)   Ht 5' 4 (1.626 m)   Wt 192 lb 4 oz (87.2 kg)   SpO2 97%   BMI 33.00 kg/m  General appearance: alert, cooperative, appears stated age, no distress, and moderately obese Head: Normocephalic, without obvious abnormality, atraumatic Eyes: negative findings: lids and lashes normal, conjunctivae and sclerae normal, corneas clear, and pupils equal, round, reactive to light and accomodation Ears: normal TM's and external ear canals both ears Nose: Nares normal. Septum midline. Mucosa normal. No drainage or sinus tenderness. Throat: lips, mucosa, and tongue normal; teeth and gums normal Neck: no adenopathy, no carotid bruit, supple, symmetrical, trachea midline, and thyroid not enlarged, symmetric, no tenderness/mass/nodules Back: symmetric, no curvature. ROM normal. No CVA  tenderness. Lungs: clear to auscultation bilaterally Heart: regular rate and rhythm, S1, S2 normal, no murmur, click, rub or gallop Abdomen: soft, non-tender; bowel sounds normal; no masses,  no organomegaly Extremities: extremities normal, atraumatic, no cyanosis or edema Pulses: 2+ and symmetric Skin: Epidermal cyst noted along the right mid back.  There is a central punctum with no active drainage but fluctuance of the cyst present.  No induration, tenderness.  Resolving surrounding hyperemia Lymph nodes: No supraclavicular or anterior cervical lymphadenopathy Neurologic: Alert and oriented X 3,  normal strength and tone. Normal symmetric reflexes. Normal coordination and gait      12/13/2024    8:31 AM 06/04/2024    9:20 AM 12/06/2023    8:04 AM  Depression screen PHQ 2/9  Decreased Interest 0 0 0  Down, Depressed, Hopeless 0 0 0  PHQ - 2 Score 0 0 0  Altered sleeping 0 0 0  Tired, decreased energy 0 0 0  Change in appetite 0 0 0  Feeling bad or failure about yourself  0 0 0  Trouble concentrating 0 0 0  Moving slowly or fidgety/restless 0 0 0  Suicidal thoughts 0 0 0  PHQ-9 Score 0 0  0   Difficult doing work/chores Not difficult at all Not difficult at all Not difficult at all     Data saved with a previous flowsheet row definition      12/13/2024    8:31 AM 06/04/2024    9:20 AM 12/06/2023    8:04 AM 09/29/2023    1:43 PM  GAD 7 : Generalized Anxiety Score  Nervous, Anxious, on Edge 0 0 0 0  Control/stop worrying 0 0 0 0  Worry too much - different things 0 0 0 0  Trouble relaxing 0 0 0 0  Restless 0 0 0 0  Easily annoyed or irritable 0 0 0 0  Afraid - awful might happen 0 0 0 0  Total GAD 7 Score 0 0 0 0  Anxiety Difficulty Not difficult at all Not difficult at all Not difficult at all Not difficult at all    No results found for this or any previous visit (from the past 2160 hours).   Assessment/ Plan: Joen MALVA Kraft here for annual physical exam.   Annual physical  exam  Atopic dermatitis of scalp - Plan: triamcinolone  cream (KENALOG ) 0.1 %, clobetasol  (TEMOVATE ) 0.05 % external solution  Epidermoid cyst  Essential hypertension - Plan: CMP14+EGFR, hydrochlorothiazide  (HYDRODIURIL ) 25 MG tablet, semaglutide -weight management (WEGOVY ) 2.4 MG/0.75ML SOAJ SQ injection, DISCONTINUED: hydrochlorothiazide  (HYDRODIURIL ) 25 MG tablet, DISCONTINUED: semaglutide -weight management (WEGOVY ) 2.4 MG/0.75ML SOAJ SQ injection, DISCONTINUED: hydrochlorothiazide  (HYDRODIURIL ) 25 MG tablet, DISCONTINUED: semaglutide -weight management (WEGOVY ) 2.4 MG/0.75ML SOAJ SQ injection  Pure hypercholesterolemia - Plan: CMP14+EGFR, Lipid Panel, TSH, semaglutide -weight management (WEGOVY ) 2.4 MG/0.75ML SOAJ SQ injection  Factor V Leiden - Plan: CMP14+EGFR, CBC with Differential  BMI 33.0-33.9,adult - Plan: CMP14+EGFR, VITAMIN D  25 Hydroxy (Vit-D Deficiency, Fractures), Bayer DCA Hb A1c Waived, semaglutide -weight management (WEGOVY ) 2.4 MG/0.75ML SOAJ SQ injection  Chronic constipation - Plan: lubiprostone  (AMITIZA ) 24 MCG capsule, DISCONTINUED: lubiprostone  (AMITIZA ) 24 MCG capsule, DISCONTINUED: lubiprostone  (AMITIZA ) 24 MCG capsule   Triamcinolone  given but clobetasol  for scalp use for that dermatitis of the scalp.  Lesion of concern is epidermoid cyst.  We discussed benign nature of this.  However, if it becomes infected low threshold to have her treated and referred to general surgery for excision.  She will contact me if this is needed  Blood pressure under excellent control.  Medications have been redirected to the [facility] pharmacy as requested.  Fasting labs collected.  Continue Wegovy  for weight management.  She is continuing to meet weight loss goals.  No red flag signs or symptoms.  Check CBC given factor V Leyden with history of DVT.  Counseled on healthy lifestyle choices, including diet (rich in fruits, vegetables and lean meats and low in salt and simple carbohydrates) and  exercise (at least 30 minutes of moderate physical activity daily).  Patient to follow up  44m for weight check and Tetanus shot  Lucille Witts M. Rayleigh Gillyard, DO        [1]  Current Outpatient Medications:    clindamycin  (CLEOCIN -T) 1 % external solution, Apply topically 2 (two) times daily., Disp: 30 mL, Rfl: 5   Fexofenadine HCl (ALLERGY 24-HR PO), Take by mouth., Disp: , Rfl:    fluticasone  (FLONASE ) 50 MCG/ACT nasal spray, Place 2 sprays into both nostrils daily., Disp: 16 g, Rfl: 6   hydrochlorothiazide  (HYDRODIURIL ) 25 MG tablet, Take 1 tablet (25 mg total) by mouth daily. Hold if BP <110/60, Disp: 90 tablet, Rfl: 3   hydroquinone  4 % cream, Apply topically 2 (two) times daily., Disp: 28.35 g, Rfl: 0   lubiprostone  (AMITIZA ) 24 MCG capsule, Take 1 capsule (24 mcg total) by mouth 2 (two) times daily with a meal. For constipation, Disp: 180 capsule, Rfl: 3   Rimegepant Sulfate (NURTEC) 75 MG TBDP, Dissolve 1 tablet in mouth ONCE if needed for migraine headache, Disp: 4 tablet, Rfl: 0   Semaglutide -Weight Management (WEGOVY ) 1.7 MG/0.75ML SOAJ, Inject 1.7 mg into the skin every 7 (seven) days. Month#4, Disp: 3 mL, Rfl: PRN   semaglutide -weight management (WEGOVY ) 2.4 MG/0.75ML SOAJ SQ injection, Inject 2.4 mg into the skin every 7 (seven) days., Disp: 9 mL, Rfl: 1 [2] No Known Allergies  "

## 2024-12-13 NOTE — Telephone Encounter (Signed)
 Okay to transfer medicine to [facility] pharmacy?

## 2024-12-13 NOTE — Patient Instructions (Signed)
 Pocket of Fluid in the Skin (Epidermoid Cyst): What to Know  An epidermoid cyst is a small lump under your skin. The cyst contains a substance that is thick and oily. What are the causes? A blocked hair follicle. A hair curls and re-enters the skin instead of growing straight out of the skin. A blocked pore. Irritated skin. An injury to the skin. Certain conditions that are passed from parent to child. Human papillomavirus (HPV). This happens rarely when cysts occur on the bottom of the feet. Long-term sun damage to the skin. What increases the risk? Having acne. Being female. Having an injury to the skin. Being past puberty. Certain conditions that are passed down through family (genetic disorder). What are the signs or symptoms? The only sign of this type of cyst may be a small, painless lump under the skin. These cysts are usually painless, but they can get infected. Symptoms of infection may include: Redness. Inflammation. Tenderness. Warmth. Fever. A bad-smelling substance that drains from the cyst. Pus that drains from the cyst. How is this treated? If a cyst becomes inflamed, treatment may include: Opening and draining the cyst. Antibiotics. Shots of medicines called steroids that help lessen inflammation. Surgery to remove the cyst if it's large, painful, or could turn into cancer. Do not try to open or squeeze a cyst yourself. Follow these instructions at home: Medicines Take your medicines only as told. If you were given antibiotics, take them as told. Do not stop taking them even if you start to feel better. General instructions Keep the area around your cyst clean and dry. Wear loose, dry clothing. Avoid touching your cyst. Check your cyst every day for signs of infection. Check for: Redness, swelling, or pain. Fluid or blood. Warmth. Pus or a bad smell. Keep all follow-up visits to make sure there's no discomfort or infection. Contact a doctor if: You have  any signs of infection. Your cyst doesn't get better or gets worse. You get a cyst that looks different from other cysts you've had. You have a fever. You have redness that spreads from the cyst. This information is not intended to replace advice given to you by your health care provider. Make sure you discuss any questions you have with your health care provider. Document Revised: 06/30/2023 Document Reviewed: 06/30/2023 Elsevier Patient Education  2024 [employer].

## 2024-12-14 LAB — CBC WITH DIFFERENTIAL/PLATELET
Basophils Absolute: 0.1 x10E3/uL (ref 0.0–0.2)
Basos: 1 %
EOS (ABSOLUTE): 0.1 x10E3/uL (ref 0.0–0.4)
Eos: 1 %
Hematocrit: 40.7 % (ref 34.0–46.6)
Hemoglobin: 13.4 g/dL (ref 11.1–15.9)
Immature Grans (Abs): 0 x10E3/uL (ref 0.0–0.1)
Immature Granulocytes: 0 %
Lymphocytes Absolute: 1.7 x10E3/uL (ref 0.7–3.1)
Lymphs: 23 %
MCH: 30.3 pg (ref 26.6–33.0)
MCHC: 32.9 g/dL (ref 31.5–35.7)
MCV: 92 fL (ref 79–97)
Monocytes Absolute: 0.6 x10E3/uL (ref 0.1–0.9)
Monocytes: 8 %
Neutrophils Absolute: 5 x10E3/uL (ref 1.4–7.0)
Neutrophils: 67 %
Platelets: 361 x10E3/uL (ref 150–450)
RBC: 4.42 x10E6/uL (ref 3.77–5.28)
RDW: 13.2 % (ref 11.7–15.4)
WBC: 7.3 x10E3/uL (ref 3.4–10.8)

## 2024-12-14 LAB — CMP14+EGFR
ALT: 19 IU/L (ref 0–32)
AST: 18 IU/L (ref 0–40)
Albumin: 3.8 g/dL — ABNORMAL LOW (ref 3.9–4.9)
Alkaline Phosphatase: 96 IU/L (ref 41–116)
BUN/Creatinine Ratio: 16 (ref 9–23)
BUN: 15 mg/dL (ref 6–24)
Bilirubin Total: 0.4 mg/dL (ref 0.0–1.2)
CO2: 23 mmol/L (ref 20–29)
Calcium: 9.2 mg/dL (ref 8.7–10.2)
Chloride: 102 mmol/L (ref 96–106)
Creatinine, Ser: 0.91 mg/dL (ref 0.57–1.00)
Globulin, Total: 2.9 g/dL (ref 1.5–4.5)
Glucose: 86 mg/dL (ref 70–99)
Potassium: 3.8 mmol/L (ref 3.5–5.2)
Sodium: 138 mmol/L (ref 134–144)
Total Protein: 6.7 g/dL (ref 6.0–8.5)
eGFR: 77 mL/min/1.73

## 2024-12-14 LAB — VITAMIN D 25 HYDROXY (VIT D DEFICIENCY, FRACTURES): Vit D, 25-Hydroxy: 23.2 ng/mL — ABNORMAL LOW (ref 30.0–100.0)

## 2024-12-14 LAB — LIPID PANEL
Chol/HDL Ratio: 3 ratio (ref 0.0–4.4)
Cholesterol, Total: 174 mg/dL (ref 100–199)
HDL: 58 mg/dL
LDL Chol Calc (NIH): 100 mg/dL — ABNORMAL HIGH (ref 0–99)
Triglycerides: 87 mg/dL (ref 0–149)
VLDL Cholesterol Cal: 16 mg/dL (ref 5–40)

## 2024-12-14 LAB — TSH: TSH: 1.64 u[IU]/mL (ref 0.450–4.500)

## 2024-12-16 ENCOUNTER — Ambulatory Visit: Payer: Self-pay | Admitting: Family Medicine

## 2024-12-16 DIAGNOSIS — E559 Vitamin D deficiency, unspecified: Secondary | ICD-10-CM

## 2024-12-16 MED ORDER — VITAMIN D (ERGOCALCIFEROL) 1.25 MG (50000 UNIT) PO CAPS: 50000.0000 [IU] | ORAL_CAPSULE | ORAL | 3 refills | Status: AC

## 2024-12-25 ENCOUNTER — Other Ambulatory Visit (HOSPITAL_BASED_OUTPATIENT_CLINIC_OR_DEPARTMENT_OTHER): Payer: Self-pay

## 2025-06-13 ENCOUNTER — Ambulatory Visit: Admitting: Family Medicine

## 2025-12-15 ENCOUNTER — Encounter: Admitting: Family Medicine
